# Patient Record
Sex: Female | Born: 1940 | Race: Black or African American | Hispanic: No | Smoking: Never smoker
Health system: Southern US, Community
[De-identification: ages and names within clinical notes are randomized; demographics above are authoritative.]

## PROBLEM LIST (undated history)

## (undated) DIAGNOSIS — E039 Hypothyroidism, unspecified: Secondary | ICD-10-CM

## (undated) DIAGNOSIS — I10 Essential (primary) hypertension: Secondary | ICD-10-CM

## (undated) DIAGNOSIS — T7840XA Allergy, unspecified, initial encounter: Secondary | ICD-10-CM

## (undated) DIAGNOSIS — E119 Type 2 diabetes mellitus without complications: Secondary | ICD-10-CM

## (undated) DIAGNOSIS — M109 Gout, unspecified: Secondary | ICD-10-CM

## (undated) DIAGNOSIS — N189 Chronic kidney disease, unspecified: Secondary | ICD-10-CM

## (undated) DIAGNOSIS — F0390 Unspecified dementia without behavioral disturbance: Secondary | ICD-10-CM

## (undated) DIAGNOSIS — J45909 Unspecified asthma, uncomplicated: Secondary | ICD-10-CM

## (undated) DIAGNOSIS — I639 Cerebral infarction, unspecified: Secondary | ICD-10-CM

## (undated) HISTORY — PX: COLONOSCOPY: SHX174

## (undated) HISTORY — DX: Unspecified asthma, uncomplicated: J45.909

## (undated) HISTORY — DX: Type 2 diabetes mellitus without complications: E11.9

## (undated) HISTORY — DX: Gout, unspecified: M10.9

## (undated) HISTORY — DX: Essential (primary) hypertension: I10

## (undated) HISTORY — PX: ROTATOR CUFF REPAIR: SHX139

## (undated) HISTORY — PX: ABDOMINAL HYSTERECTOMY: SHX81

## (undated) HISTORY — DX: Allergy, unspecified, initial encounter: T78.40XA

## (undated) HISTORY — PX: BACK SURGERY: SHX140

---

## 2004-04-29 ENCOUNTER — Other Ambulatory Visit: Payer: Self-pay

## 2005-03-10 ENCOUNTER — Ambulatory Visit: Payer: Self-pay | Admitting: General Surgery

## 2006-03-19 ENCOUNTER — Ambulatory Visit: Payer: Self-pay | Admitting: Gastroenterology

## 2006-03-21 ENCOUNTER — Ambulatory Visit: Payer: Self-pay | Admitting: Physician Assistant

## 2006-03-25 ENCOUNTER — Ambulatory Visit: Payer: Self-pay | Admitting: General Surgery

## 2006-05-21 ENCOUNTER — Other Ambulatory Visit: Payer: Self-pay

## 2006-05-21 ENCOUNTER — Ambulatory Visit: Payer: Self-pay | Admitting: Unknown Physician Specialty

## 2006-05-27 ENCOUNTER — Ambulatory Visit: Payer: Self-pay | Admitting: Family Medicine

## 2006-05-28 ENCOUNTER — Inpatient Hospital Stay: Payer: Self-pay | Admitting: Unknown Physician Specialty

## 2006-06-17 ENCOUNTER — Encounter: Payer: Self-pay | Admitting: Unknown Physician Specialty

## 2006-07-11 ENCOUNTER — Encounter: Payer: Self-pay | Admitting: Unknown Physician Specialty

## 2006-08-10 ENCOUNTER — Encounter: Payer: Self-pay | Admitting: Unknown Physician Specialty

## 2006-09-10 ENCOUNTER — Encounter: Payer: Self-pay | Admitting: Unknown Physician Specialty

## 2006-10-10 ENCOUNTER — Encounter: Payer: Self-pay | Admitting: Unknown Physician Specialty

## 2006-11-10 ENCOUNTER — Encounter: Payer: Self-pay | Admitting: Unknown Physician Specialty

## 2006-12-11 ENCOUNTER — Encounter: Payer: Self-pay | Admitting: Unknown Physician Specialty

## 2007-03-09 ENCOUNTER — Ambulatory Visit: Payer: Self-pay | Admitting: Specialist

## 2007-03-23 ENCOUNTER — Ambulatory Visit: Payer: Self-pay | Admitting: Family Medicine

## 2007-03-29 ENCOUNTER — Ambulatory Visit: Payer: Self-pay | Admitting: Specialist

## 2007-03-30 ENCOUNTER — Ambulatory Visit: Payer: Self-pay | Admitting: General Surgery

## 2007-05-05 ENCOUNTER — Encounter: Payer: Self-pay | Admitting: Orthopedic Surgery

## 2007-05-11 ENCOUNTER — Encounter: Payer: Self-pay | Admitting: Orthopedic Surgery

## 2007-06-11 ENCOUNTER — Encounter: Payer: Self-pay | Admitting: Orthopedic Surgery

## 2007-07-12 ENCOUNTER — Encounter: Payer: Self-pay | Admitting: Orthopedic Surgery

## 2008-04-13 ENCOUNTER — Ambulatory Visit: Payer: Self-pay | Admitting: General Surgery

## 2009-05-10 ENCOUNTER — Ambulatory Visit: Payer: Self-pay | Admitting: General Surgery

## 2010-05-14 ENCOUNTER — Ambulatory Visit: Payer: Self-pay | Admitting: General Surgery

## 2010-11-07 ENCOUNTER — Ambulatory Visit: Payer: Self-pay | Admitting: Orthopedic Surgery

## 2010-12-31 ENCOUNTER — Encounter: Payer: Self-pay | Admitting: Orthopedic Surgery

## 2011-01-09 ENCOUNTER — Encounter: Payer: Self-pay | Admitting: Orthopedic Surgery

## 2011-01-24 ENCOUNTER — Ambulatory Visit: Payer: Self-pay | Admitting: Internal Medicine

## 2011-05-13 ENCOUNTER — Ambulatory Visit: Payer: Self-pay | Admitting: Emergency Medicine

## 2011-05-15 ENCOUNTER — Ambulatory Visit: Payer: Self-pay | Admitting: Emergency Medicine

## 2011-05-15 LAB — CEA: CEA: 3 ng/mL (ref 0.0–4.7)

## 2011-05-20 ENCOUNTER — Ambulatory Visit: Payer: Self-pay | Admitting: Internal Medicine

## 2011-05-21 ENCOUNTER — Ambulatory Visit: Payer: Self-pay | Admitting: General Surgery

## 2011-09-02 ENCOUNTER — Ambulatory Visit: Payer: Self-pay | Admitting: Internal Medicine

## 2012-02-03 ENCOUNTER — Ambulatory Visit: Payer: Self-pay | Admitting: Internal Medicine

## 2012-02-09 ENCOUNTER — Ambulatory Visit: Payer: Self-pay | Admitting: Internal Medicine

## 2012-09-14 ENCOUNTER — Encounter: Payer: Self-pay | Admitting: Specialist

## 2012-10-10 ENCOUNTER — Encounter: Payer: Self-pay | Admitting: Specialist

## 2012-11-10 ENCOUNTER — Encounter: Payer: Self-pay | Admitting: Specialist

## 2012-12-11 ENCOUNTER — Encounter: Payer: Self-pay | Admitting: Specialist

## 2013-01-08 ENCOUNTER — Encounter: Payer: Self-pay | Admitting: Specialist

## 2013-02-08 ENCOUNTER — Encounter: Payer: Self-pay | Admitting: Specialist

## 2013-09-05 ENCOUNTER — Encounter: Payer: Self-pay | Admitting: *Deleted

## 2013-09-06 ENCOUNTER — Encounter: Payer: Self-pay | Admitting: Podiatry

## 2013-09-06 ENCOUNTER — Ambulatory Visit (INDEPENDENT_AMBULATORY_CARE_PROVIDER_SITE_OTHER): Payer: Medicare Other | Admitting: Podiatry

## 2013-09-06 VITALS — BP 139/63 | HR 84 | Resp 16 | Ht 62.0 in | Wt 225.0 lb

## 2013-09-06 DIAGNOSIS — M2040 Other hammer toe(s) (acquired), unspecified foot: Secondary | ICD-10-CM

## 2013-09-06 DIAGNOSIS — E1159 Type 2 diabetes mellitus with other circulatory complications: Secondary | ICD-10-CM

## 2013-09-06 NOTE — Progress Notes (Signed)
Subjective:     Patient ID: Maria Cunningham, female   DOB: Mar 16, 1941, 72 y.o.   MRN: [id number]  HPI patient states I am here to be measured for my diabetic shoes. States that her feet are painful and that she's had diabetes for a long time   Review of Systems  All other systems reviewed and are negative.       Objective:   Physical Exam  Nursing note and vitals reviewed. Constitutional: She is oriented to person, place, and time. She appears well-nourished.  Cardiovascular: Intact distal pulses.   Musculoskeletal: Normal range of motion.  Neurological: She is oriented to person, place, and time.   Patient is found to have structural changes in both feet with mild edema and diminishment of vibratory sensation in circulatory    Assessment:     At risk diabetic with circulatory vascular issues and structural    Plan:     Casted for diabetic shoes to wear to prevent ulceration

## 2013-10-18 ENCOUNTER — Telehealth: Payer: Self-pay | Admitting: *Deleted

## 2013-10-18 NOTE — Telephone Encounter (Signed)
Pt asked for an appt to pick up her diabetic shoes.

## 2013-10-28 ENCOUNTER — Ambulatory Visit (INDEPENDENT_AMBULATORY_CARE_PROVIDER_SITE_OTHER): Payer: Medicare Other | Admitting: Podiatry

## 2013-10-28 ENCOUNTER — Encounter: Payer: Self-pay | Admitting: Podiatry

## 2013-10-28 VITALS — BP 129/71 | HR 84 | Resp 16

## 2013-10-28 DIAGNOSIS — M2010 Hallux valgus (acquired), unspecified foot: Secondary | ICD-10-CM

## 2013-10-28 DIAGNOSIS — B351 Tinea unguium: Secondary | ICD-10-CM

## 2013-10-28 DIAGNOSIS — M79609 Pain in unspecified limb: Secondary | ICD-10-CM

## 2013-10-28 DIAGNOSIS — E1159 Type 2 diabetes mellitus with other circulatory complications: Secondary | ICD-10-CM

## 2013-10-28 NOTE — Progress Notes (Signed)
Subjective:     Patient ID: Maria Cunningham, female   DOB: 03-01-1941, 72 y.o.   MRN: [id number]  HPI patient states that I need my nails taking care of and I'm here to pick up my diabetic shoes. States the nails are been bothering her in her feet in general bother her   Review of Systems     Objective:   Physical Exam Neurovascular status unchanged with diminishment of sharp tall vibratory and circulatory status along with structural bunion deformity and also nail disease 1-5 of both feet    Assessment:     At wrist diabetic with painful nailbeds and foot structure that lends itself towards ulcerations for the long-term    Plan:     H&P performed and debridement of nailbeds 1-5 both feet done today. We fitted for diabetic shoes they do well and inserts feel good the patient and she was given instructions on in. Reappoint her recheck

## 2013-10-28 NOTE — Progress Notes (Signed)
   Subjective:    Patient ID: Maria Cunningham, female    DOB: July 15, 1941, 72 y.o.   MRN: [id number]  HPI Comments: Trying on my new shoes and trimming my nails   Diabetes      Review of Systems     Objective:   Physical Exam        Assessment & Plan:

## 2020-02-28 ENCOUNTER — Encounter (HOSPITAL_COMMUNITY): Payer: Self-pay | Admitting: Pharmacy Technician

## 2020-02-28 ENCOUNTER — Other Ambulatory Visit: Payer: Self-pay

## 2020-02-28 ENCOUNTER — Emergency Department (HOSPITAL_COMMUNITY): Payer: Medicare Other

## 2020-02-28 ENCOUNTER — Emergency Department (HOSPITAL_COMMUNITY)
Admission: EM | Admit: 2020-02-28 | Discharge: 2020-02-29 | Disposition: A | Discharge status: Home / Self Care | Payer: Medicare Other | Attending: Emergency Medicine | Admitting: Emergency Medicine

## 2020-02-28 DIAGNOSIS — Y929 Unspecified place or not applicable: Secondary | ICD-10-CM | POA: Diagnosis not present

## 2020-02-28 DIAGNOSIS — Z79899 Other long term (current) drug therapy: Secondary | ICD-10-CM | POA: Diagnosis not present

## 2020-02-28 DIAGNOSIS — S1093XA Contusion of unspecified part of neck, initial encounter: Secondary | ICD-10-CM | POA: Insufficient documentation

## 2020-02-28 DIAGNOSIS — I10 Essential (primary) hypertension: Secondary | ICD-10-CM | POA: Diagnosis not present

## 2020-02-28 DIAGNOSIS — Z7982 Long term (current) use of aspirin: Secondary | ICD-10-CM | POA: Diagnosis not present

## 2020-02-28 DIAGNOSIS — S0990XA Unspecified injury of head, initial encounter: Secondary | ICD-10-CM | POA: Insufficient documentation

## 2020-02-28 DIAGNOSIS — E119 Type 2 diabetes mellitus without complications: Secondary | ICD-10-CM | POA: Insufficient documentation

## 2020-02-28 DIAGNOSIS — Y939 Activity, unspecified: Secondary | ICD-10-CM | POA: Diagnosis not present

## 2020-02-28 DIAGNOSIS — S0181XA Laceration without foreign body of other part of head, initial encounter: Secondary | ICD-10-CM | POA: Insufficient documentation

## 2020-02-28 DIAGNOSIS — Y999 Unspecified external cause status: Secondary | ICD-10-CM | POA: Insufficient documentation

## 2020-02-28 DIAGNOSIS — Z7984 Long term (current) use of oral hypoglycemic drugs: Secondary | ICD-10-CM | POA: Insufficient documentation

## 2020-02-28 LAB — COMPREHENSIVE METABOLIC PANEL
ALT: 18 U/L (ref 0–44)
AST: 19 U/L (ref 15–41)
Albumin: 3.6 g/dL (ref 3.5–5.0)
Alkaline Phosphatase: 53 U/L (ref 38–126)
Anion gap: 10 (ref 5–15)
BUN: 54 mg/dL — ABNORMAL HIGH (ref 8–23)
CO2: 21 mmol/L — ABNORMAL LOW (ref 22–32)
Calcium: 9.8 mg/dL (ref 8.9–10.3)
Chloride: 106 mmol/L (ref 98–111)
Creatinine, Ser: 2.02 mg/dL — ABNORMAL HIGH (ref 0.44–1.00)
GFR calc Af Amer: 27 mL/min — ABNORMAL LOW (ref >60)
GFR calc non Af Amer: 23 mL/min — ABNORMAL LOW (ref >60)
Glucose, Bld: 192 mg/dL — ABNORMAL HIGH (ref 70–99)
Potassium: 3.4 mmol/L — ABNORMAL LOW (ref 3.5–5.1)
Sodium: 137 mmol/L (ref 135–145)
Total Bilirubin: 0.5 mg/dL (ref 0.3–1.2)
Total Protein: 7.2 g/dL (ref 6.5–8.1)

## 2020-02-28 LAB — CBC WITH DIFFERENTIAL/PLATELET
Abs Immature Granulocytes: 0.1 10*3/uL — ABNORMAL HIGH (ref 0.00–0.07)
Basophils Absolute: 0 10*3/uL (ref 0.0–0.1)
Basophils Relative: 0 %
Eosinophils Absolute: 0.1 10*3/uL (ref 0.0–0.5)
Eosinophils Relative: 1 %
HCT: 38.3 % (ref 36.0–46.0)
Hemoglobin: 12 g/dL (ref 12.0–15.0)
Immature Granulocytes: 1 %
Lymphocytes Relative: 27 %
Lymphs Abs: 4.2 10*3/uL — ABNORMAL HIGH (ref 0.7–4.0)
MCH: 28.7 pg (ref 26.0–34.0)
MCHC: 31.3 g/dL (ref 30.0–36.0)
MCV: 91.6 fL (ref 80.0–100.0)
Monocytes Absolute: 1.1 10*3/uL — ABNORMAL HIGH (ref 0.1–1.0)
Monocytes Relative: 7 %
Neutro Abs: 10 10*3/uL — ABNORMAL HIGH (ref 1.7–7.7)
Neutrophils Relative %: 64 %
Platelets: 292 10*3/uL (ref 150–400)
RBC: 4.18 MIL/uL (ref 3.87–5.11)
RDW: 16 % — ABNORMAL HIGH (ref 11.5–15.5)
WBC: 15.5 10*3/uL — ABNORMAL HIGH (ref 4.0–10.5)
nRBC: 0 % (ref 0.0–0.2)

## 2020-02-28 LAB — I-STAT CHEM 8, ED
BUN: 47 mg/dL — ABNORMAL HIGH (ref 8–23)
Calcium, Ion: 1.33 mmol/L (ref 1.15–1.40)
Chloride: 107 mmol/L (ref 98–111)
Creatinine, Ser: 1.8 mg/dL — ABNORMAL HIGH (ref 0.44–1.00)
Glucose, Bld: 180 mg/dL — ABNORMAL HIGH (ref 70–99)
HCT: 39 % (ref 36.0–46.0)
Hemoglobin: 13.3 g/dL (ref 12.0–15.0)
Potassium: 3.4 mmol/L — ABNORMAL LOW (ref 3.5–5.1)
Sodium: 140 mmol/L (ref 135–145)
TCO2: 22 mmol/L (ref 22–32)

## 2020-02-28 LAB — PROTIME-INR
INR: 1 (ref 0.8–1.2)
Prothrombin Time: 13 seconds (ref 11.4–15.2)

## 2020-02-28 LAB — ETHANOL: Alcohol, Ethyl (B): <10 mg/dL (ref <10)

## 2020-02-28 LAB — TROPONIN I (HIGH SENSITIVITY): Troponin I (High Sensitivity): 10 ng/L (ref <18)

## 2020-02-28 MED ORDER — SODIUM CHLORIDE 0.9 % IV SOLN: Freq: Once | INTRAVENOUS | Status: DC
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
## 2020-02-28 NOTE — Progress Notes (Signed)
Chaplain engaged in initial visit with Maria Cunningham and offered support.  Chaplain is available to follow-up as needed.
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
## 2020-06-15 ENCOUNTER — Encounter: Payer: Self-pay | Admitting: Emergency Medicine

## 2020-06-15 ENCOUNTER — Emergency Department
Admission: EM | Admit: 2020-06-15 | Discharge: 2020-06-16 | Disposition: A | Discharge status: Home / Self Care | Payer: Medicare Other | Attending: Emergency Medicine | Admitting: Emergency Medicine

## 2020-06-15 ENCOUNTER — Emergency Department: Payer: Medicare Other

## 2020-06-15 ENCOUNTER — Other Ambulatory Visit: Payer: Self-pay

## 2020-06-15 DIAGNOSIS — Z5321 Procedure and treatment not carried out due to patient leaving prior to being seen by health care provider: Secondary | ICD-10-CM | POA: Insufficient documentation

## 2020-06-15 DIAGNOSIS — R079 Chest pain, unspecified: Secondary | ICD-10-CM | POA: Insufficient documentation

## 2020-06-15 LAB — BASIC METABOLIC PANEL
Anion gap: 10 (ref 5–15)
BUN: 26 mg/dL — ABNORMAL HIGH (ref 8–23)
CO2: 25 mmol/L (ref 22–32)
Calcium: 9.5 mg/dL (ref 8.9–10.3)
Chloride: 101 mmol/L (ref 98–111)
Creatinine, Ser: 1.29 mg/dL — ABNORMAL HIGH (ref 0.44–1.00)
GFR calc Af Amer: 46 mL/min — ABNORMAL LOW (ref >60)
GFR calc non Af Amer: 39 mL/min — ABNORMAL LOW (ref >60)
Glucose, Bld: 120 mg/dL — ABNORMAL HIGH (ref 70–99)
Potassium: 4.1 mmol/L (ref 3.5–5.1)
Sodium: 136 mmol/L (ref 135–145)

## 2020-06-15 LAB — CBC
HCT: 35 % — ABNORMAL LOW (ref 36.0–46.0)
Hemoglobin: 11.7 g/dL — ABNORMAL LOW (ref 12.0–15.0)
MCH: 30.6 pg (ref 26.0–34.0)
MCHC: 33.4 g/dL (ref 30.0–36.0)
MCV: 91.6 fL (ref 80.0–100.0)
Platelets: 257 10*3/uL (ref 150–400)
RBC: 3.82 MIL/uL — ABNORMAL LOW (ref 3.87–5.11)
RDW: 13.8 % (ref 11.5–15.5)
WBC: 7.3 10*3/uL (ref 4.0–10.5)
nRBC: 0 % (ref 0.0–0.2)

## 2020-06-15 LAB — TROPONIN I (HIGH SENSITIVITY)
Troponin I (High Sensitivity): 8 ng/L (ref <18)
Troponin I (High Sensitivity): 9 ng/L (ref <18)

## 2020-06-15 NOTE — ED Triage Notes (Signed)
Here for right side chest pain starting today. Pain is intermittent. Denies any associated sx. NAD. Unlabored, VSS.  No history of similar.

## 2020-08-02 ENCOUNTER — Other Ambulatory Visit: Payer: Self-pay | Admitting: Internal Medicine

## 2020-08-02 DIAGNOSIS — Z1231 Encounter for screening mammogram for malignant neoplasm of breast: Secondary | ICD-10-CM

## 2020-08-23 ENCOUNTER — Other Ambulatory Visit (HOSPITAL_COMMUNITY): Payer: Self-pay | Admitting: Internal Medicine

## 2020-08-23 DIAGNOSIS — R413 Other amnesia: Secondary | ICD-10-CM

## 2020-09-06 ENCOUNTER — Other Ambulatory Visit: Payer: Self-pay

## 2020-09-06 ENCOUNTER — Ambulatory Visit (HOSPITAL_COMMUNITY)
Admission: RE | Admit: 2020-09-06 | Discharge: 2020-09-06 | Disposition: A | Discharge status: Home / Self Care | Payer: Medicare Other | Source: Ambulatory Visit | Attending: Internal Medicine | Admitting: Internal Medicine

## 2020-09-06 DIAGNOSIS — R413 Other amnesia: Secondary | ICD-10-CM | POA: Diagnosis not present

## 2021-06-26 ENCOUNTER — Other Ambulatory Visit: Payer: Self-pay | Admitting: Internal Medicine

## 2021-06-26 DIAGNOSIS — N178 Other acute kidney failure: Secondary | ICD-10-CM

## 2021-07-09 ENCOUNTER — Ambulatory Visit: Admission: RE | Admit: 2021-07-09 | Payer: Medicare Other | Source: Ambulatory Visit

## 2021-07-31 ENCOUNTER — Ambulatory Visit
Admission: RE | Admit: 2021-07-31 | Discharge: 2021-07-31 | Disposition: A | Discharge status: Home / Self Care | Payer: Medicare Other | Source: Ambulatory Visit | Attending: Internal Medicine | Admitting: Internal Medicine

## 2021-07-31 ENCOUNTER — Other Ambulatory Visit: Payer: Self-pay

## 2021-07-31 DIAGNOSIS — N178 Other acute kidney failure: Secondary | ICD-10-CM | POA: Insufficient documentation

## 2022-05-29 IMAGING — US US RENAL
1 series · 14 of 25 positions shown · non-contrast
Comparison: Abdominopelvic CT 05/15/2011, no interval imaging
available.

CLINICAL DATA: Acute renal failure with other unspecified
pathological lesion and kidney.

EXAM:
RENAL / URINARY TRACT ULTRASOUND COMPLETE

[Series 1: us renal · 0.17mm/px · 14 of 55 slices shown]
[im 1/55]
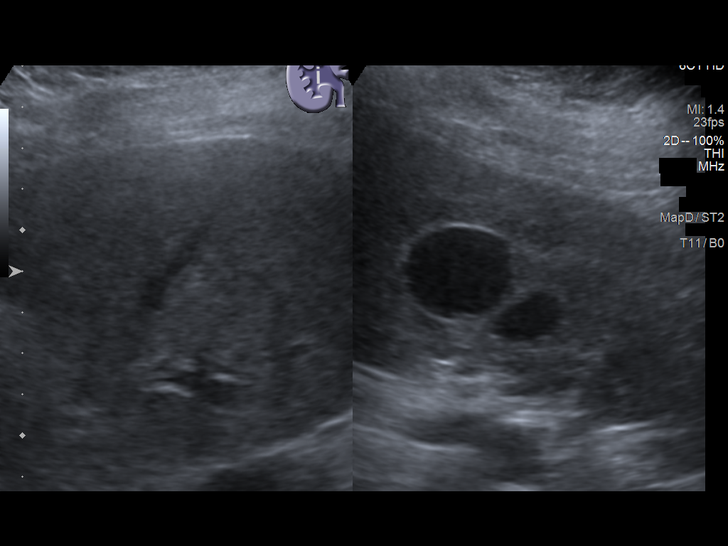
[im 5/55]
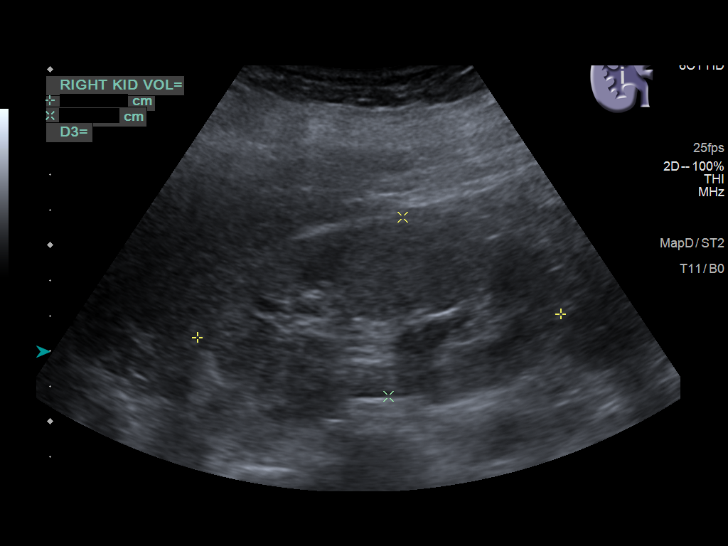
[im 10/55]
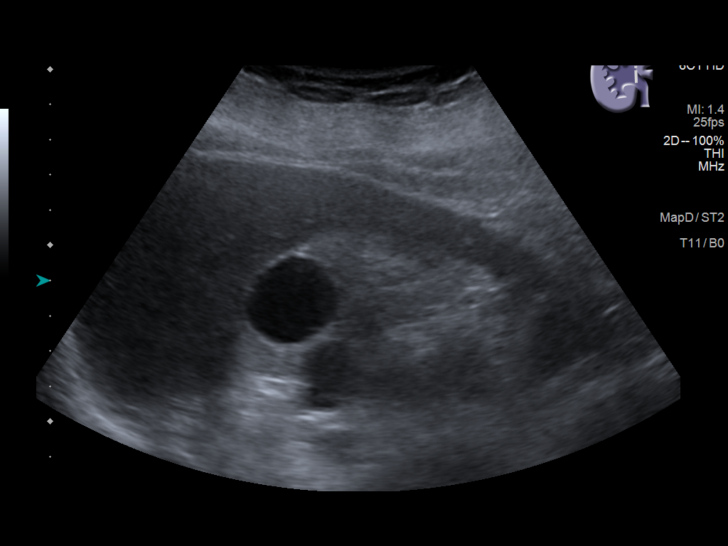
[im 14/55]
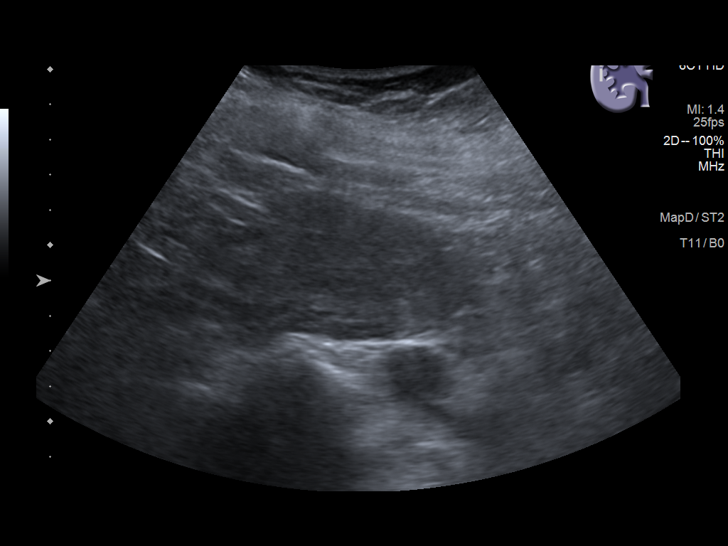
[im 19/55]
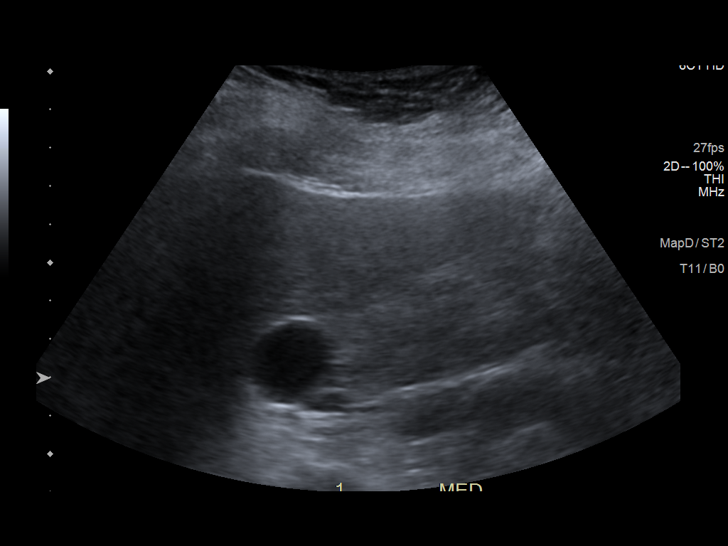
[im 21/55]
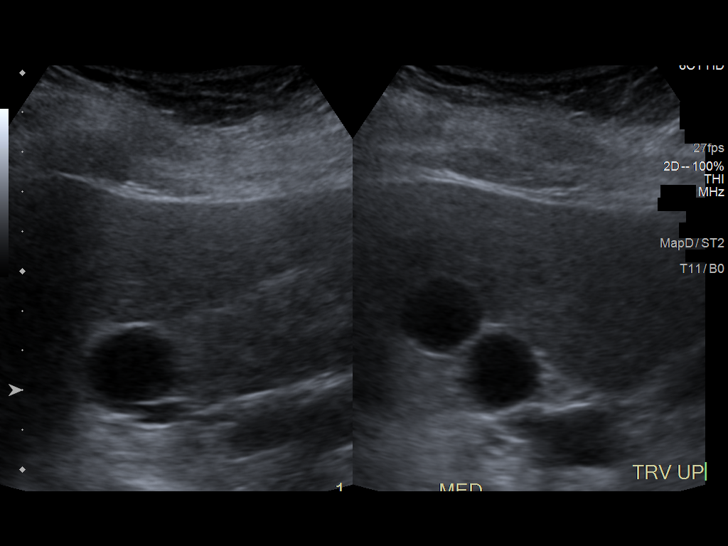
[im 25/55]
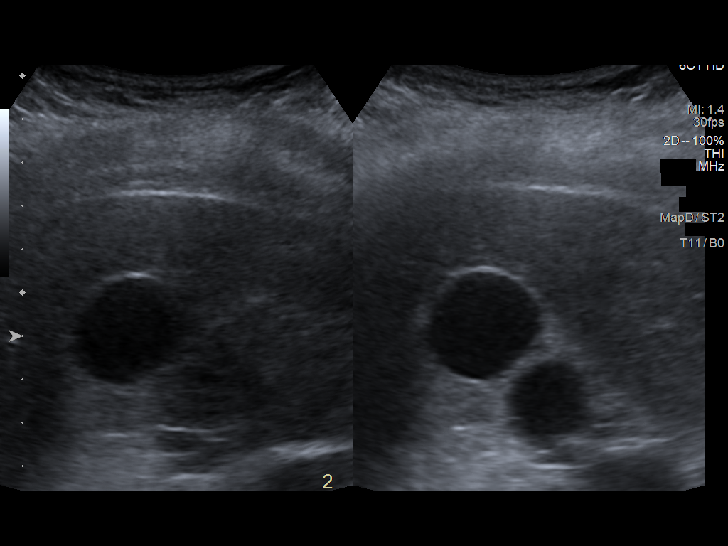
[im 30/55]
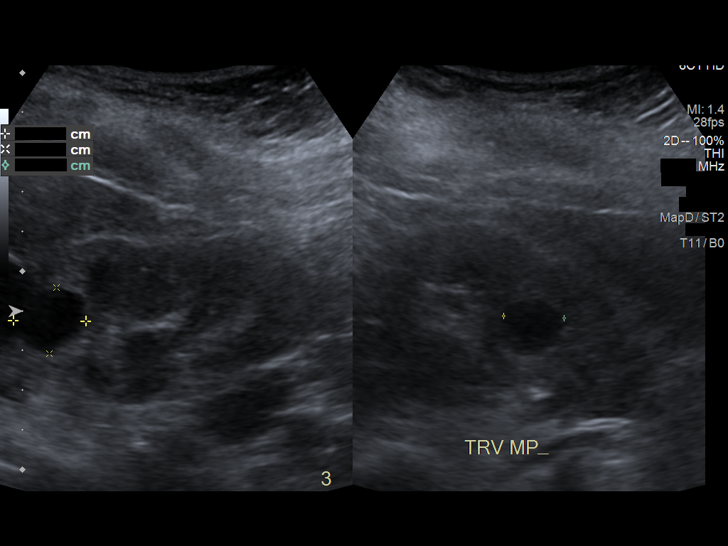
[im 34/55]
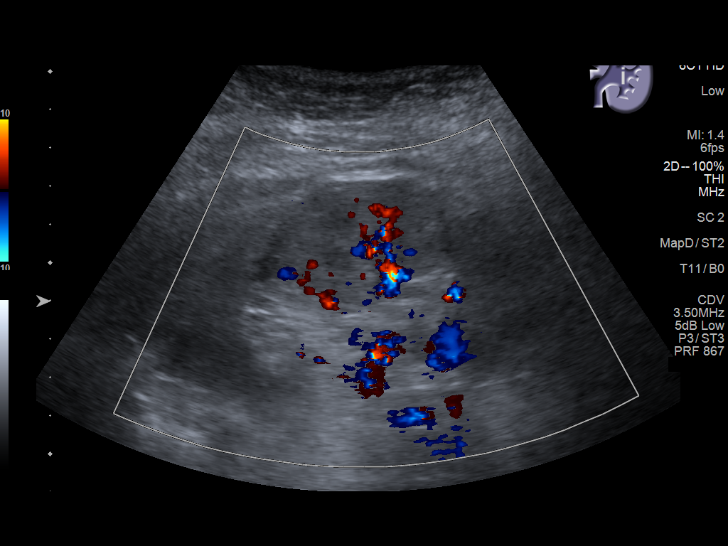
[im 37/55]
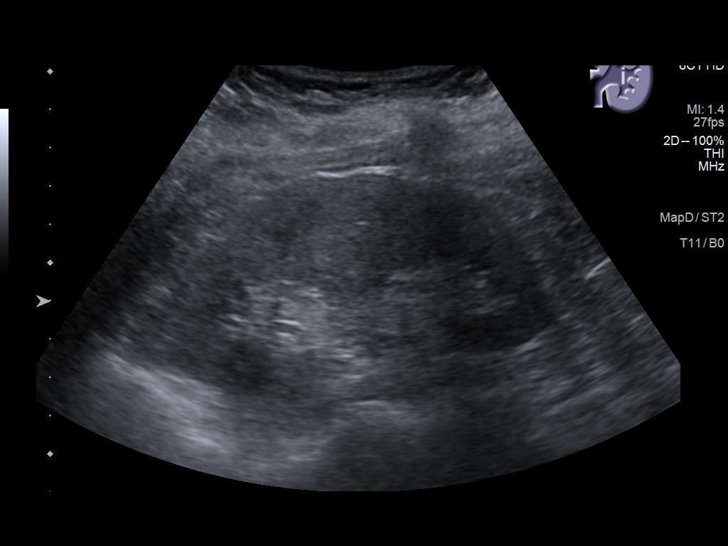
[im 41/55]
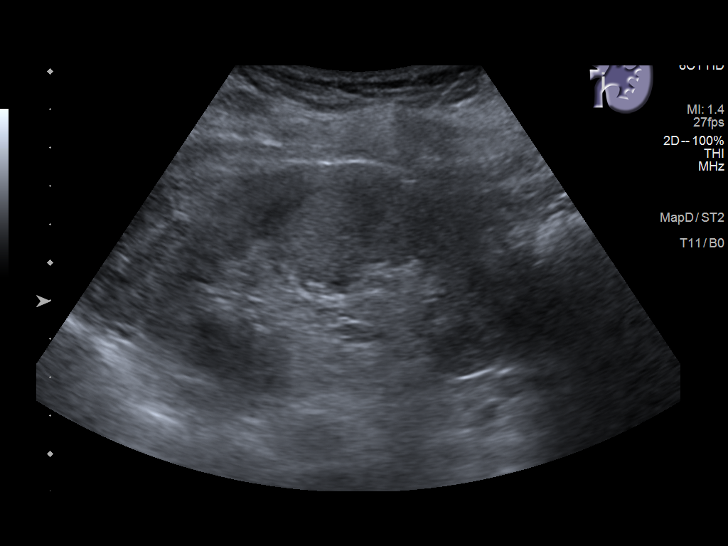
[im 46/55]
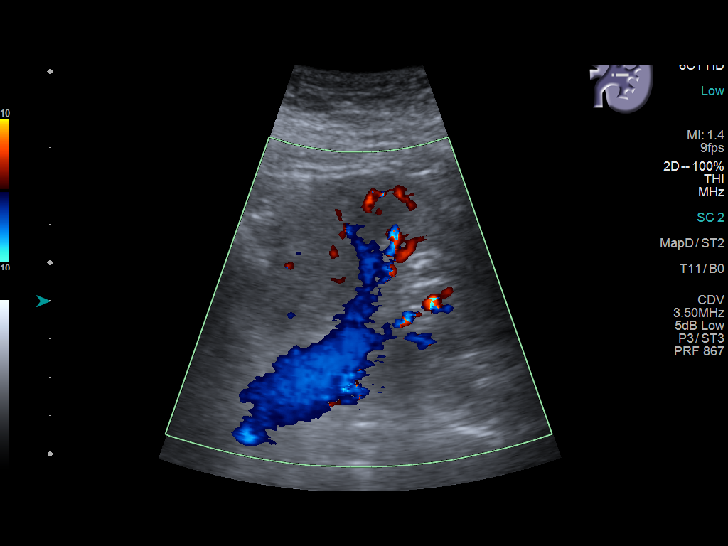
[im 50/55]
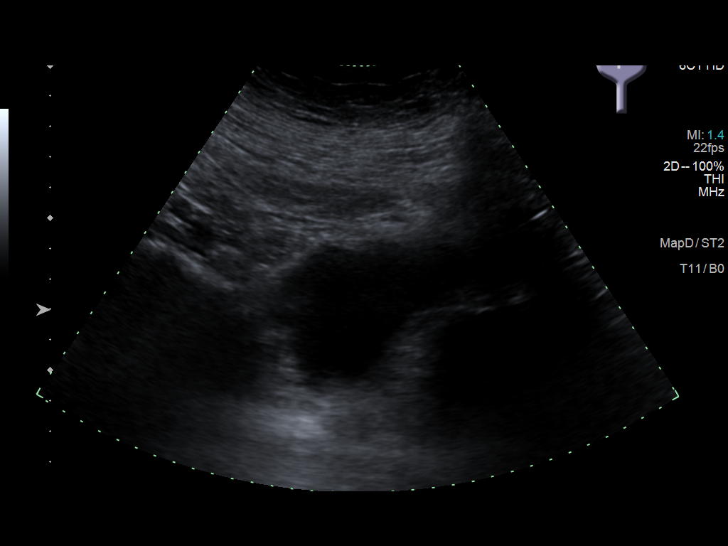
[im 55/55]
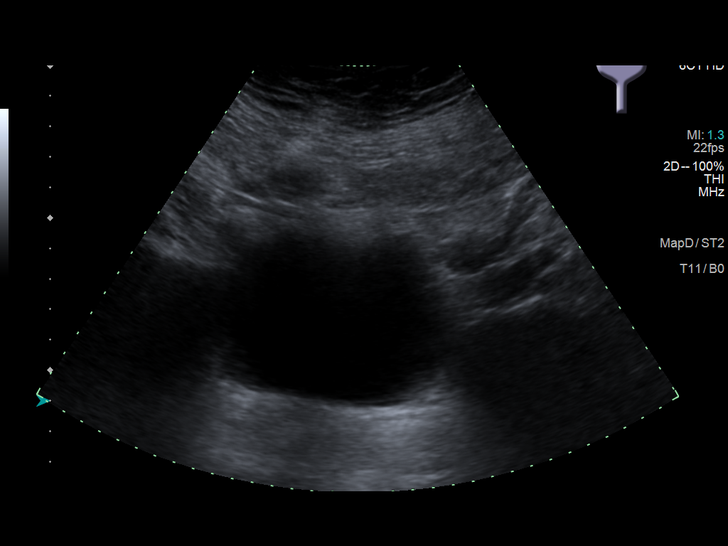

[14 of 25 positions shown; findings below may reference images not displayed]

FINDINGS: Right Kidney:

Renal measurements: 10.3 x 5.1 x 3.6 cm = volume: 98 mL. No
hydronephrosis. Borderline increased parenchymal echogenicity. There
are 3 cysts in the mid and upper right kidney. Largest cyst measures
2.7 x 2.7 x 2.6 cm. There is an adjacent 2.0 x 2.4 x 1.8 cm cyst.
Cyst in the mid kidney measures 1.8 x 1.7 x 1.5 cm. No evidence of
solid lesion. No visualized stone.

Left Kidney:

Renal measurements: 11.3 x 5.0 x 5.0 cm = volume: 147 mL. Mildly
increased echogenicity. No hydronephrosis. No visualized stone or
focal lesion.

Bladder:

Appears normal for degree of bladder distention.

Other:

None.
IMPRESSION: 1. Mildly increased renal parenchymal echogenicity consistent with
chronic medical renal disease. No obstructive uropathy.
2. Three simple cysts in the mid and upper right kidney. No solid
lesion.

## 2022-08-18 ENCOUNTER — Emergency Department: Payer: Medicare Other

## 2022-08-18 ENCOUNTER — Other Ambulatory Visit: Payer: Self-pay

## 2022-08-18 DIAGNOSIS — N184 Chronic kidney disease, stage 4 (severe): Secondary | ICD-10-CM | POA: Insufficient documentation

## 2022-08-18 DIAGNOSIS — E039 Hypothyroidism, unspecified: Secondary | ICD-10-CM | POA: Insufficient documentation

## 2022-08-18 DIAGNOSIS — R55 Syncope and collapse: Secondary | ICD-10-CM | POA: Diagnosis present

## 2022-08-18 DIAGNOSIS — F0390 Unspecified dementia without behavioral disturbance: Secondary | ICD-10-CM | POA: Insufficient documentation

## 2022-08-18 DIAGNOSIS — J45909 Unspecified asthma, uncomplicated: Secondary | ICD-10-CM | POA: Diagnosis not present

## 2022-08-18 DIAGNOSIS — I129 Hypertensive chronic kidney disease with stage 1 through stage 4 chronic kidney disease, or unspecified chronic kidney disease: Secondary | ICD-10-CM | POA: Diagnosis not present

## 2022-08-18 DIAGNOSIS — E1122 Type 2 diabetes mellitus with diabetic chronic kidney disease: Secondary | ICD-10-CM | POA: Diagnosis not present

## 2022-08-18 DIAGNOSIS — Z79899 Other long term (current) drug therapy: Secondary | ICD-10-CM | POA: Insufficient documentation

## 2022-08-18 DIAGNOSIS — N179 Acute kidney failure, unspecified: Secondary | ICD-10-CM | POA: Diagnosis not present

## 2022-08-18 DIAGNOSIS — Z7982 Long term (current) use of aspirin: Secondary | ICD-10-CM | POA: Diagnosis not present

## 2022-08-18 DIAGNOSIS — Z7984 Long term (current) use of oral hypoglycemic drugs: Secondary | ICD-10-CM | POA: Insufficient documentation

## 2022-08-18 LAB — BASIC METABOLIC PANEL
Anion gap: 6 (ref 5–15)
BUN: 36 mg/dL — ABNORMAL HIGH (ref 8–23)
CO2: 25 mmol/L (ref 22–32)
Calcium: 9.2 mg/dL (ref 8.9–10.3)
Chloride: 112 mmol/L — ABNORMAL HIGH (ref 98–111)
Creatinine, Ser: 2.24 mg/dL — ABNORMAL HIGH (ref 0.44–1.00)
GFR, Estimated: 21 mL/min — ABNORMAL LOW (ref >60)
Glucose, Bld: 130 mg/dL — ABNORMAL HIGH (ref 70–99)
Potassium: 3.9 mmol/L (ref 3.5–5.1)
Sodium: 143 mmol/L (ref 135–145)

## 2022-08-18 LAB — CBC
HCT: 35.7 % — ABNORMAL LOW (ref 36.0–46.0)
Hemoglobin: 11.2 g/dL — ABNORMAL LOW (ref 12.0–15.0)
MCH: 29.8 pg (ref 26.0–34.0)
MCHC: 31.4 g/dL (ref 30.0–36.0)
MCV: 94.9 fL (ref 80.0–100.0)
Platelets: 244 10*3/uL (ref 150–400)
RBC: 3.76 MIL/uL — ABNORMAL LOW (ref 3.87–5.11)
RDW: 13.7 % (ref 11.5–15.5)
WBC: 9 10*3/uL (ref 4.0–10.5)
nRBC: 0 % (ref 0.0–0.2)

## 2022-08-18 LAB — CBG MONITORING, ED: Glucose-Capillary: 135 mg/dL — ABNORMAL HIGH (ref 70–99)

## 2022-08-18 LAB — TROPONIN I (HIGH SENSITIVITY): Troponin I (High Sensitivity): 7 ng/L (ref <18)

## 2022-08-18 NOTE — ED Triage Notes (Signed)
Pt comes from via EMS with c/o syncope. Pt states she passed out while talking to her daughter. Daughter states they were mid conversation when pt laid back and stopped moving/ responding. NAD at this time. Denies CP and SOB

## 2022-08-19 ENCOUNTER — Observation Stay
Admit: 2022-08-19 | Discharge: 2022-08-19 | Disposition: A | Discharge status: Home / Self Care | Payer: Medicare Other | Attending: Internal Medicine | Admitting: Internal Medicine

## 2022-08-19 ENCOUNTER — Other Ambulatory Visit: Payer: Self-pay

## 2022-08-19 ENCOUNTER — Encounter: Payer: Self-pay | Admitting: Internal Medicine

## 2022-08-19 ENCOUNTER — Observation Stay: Payer: Medicare Other

## 2022-08-19 ENCOUNTER — Observation Stay
Admission: EM | Admit: 2022-08-19 | Discharge: 2022-08-20 | Disposition: A | Discharge status: Home / Self Care | Payer: Medicare Other | Attending: Internal Medicine | Admitting: Internal Medicine

## 2022-08-19 DIAGNOSIS — E039 Hypothyroidism, unspecified: Secondary | ICD-10-CM | POA: Diagnosis present

## 2022-08-19 DIAGNOSIS — I10 Essential (primary) hypertension: Secondary | ICD-10-CM | POA: Diagnosis present

## 2022-08-19 DIAGNOSIS — M109 Gout, unspecified: Secondary | ICD-10-CM | POA: Diagnosis present

## 2022-08-19 DIAGNOSIS — N189 Chronic kidney disease, unspecified: Secondary | ICD-10-CM

## 2022-08-19 DIAGNOSIS — N179 Acute kidney failure, unspecified: Secondary | ICD-10-CM

## 2022-08-19 DIAGNOSIS — E1129 Type 2 diabetes mellitus with other diabetic kidney complication: Secondary | ICD-10-CM | POA: Diagnosis present

## 2022-08-19 DIAGNOSIS — J4520 Mild intermittent asthma, uncomplicated: Secondary | ICD-10-CM

## 2022-08-19 DIAGNOSIS — R55 Syncope and collapse: Secondary | ICD-10-CM | POA: Diagnosis not present

## 2022-08-19 DIAGNOSIS — N184 Chronic kidney disease, stage 4 (severe): Secondary | ICD-10-CM

## 2022-08-19 DIAGNOSIS — J45909 Unspecified asthma, uncomplicated: Secondary | ICD-10-CM | POA: Diagnosis present

## 2022-08-19 LAB — URINALYSIS, ROUTINE W REFLEX MICROSCOPIC
Bilirubin Urine: NEGATIVE
Glucose, UA: NEGATIVE mg/dL
Hgb urine dipstick: NEGATIVE
Ketones, ur: NEGATIVE mg/dL
Nitrite: NEGATIVE
Protein, ur: 100 mg/dL — AB
Specific Gravity, Urine: 1.016 (ref 1.005–1.030)
pH: 5 (ref 5.0–8.0)

## 2022-08-19 LAB — ECHOCARDIOGRAM COMPLETE
AR max vel: 1.9 cm2
AV Area VTI: 1.87 cm2
AV Area mean vel: 1.91 cm2
AV Mean grad: 6 mmHg
AV Peak grad: 10.9 mmHg
Ao pk vel: 1.65 m/s
Area-P 1/2: 3.54 cm2
S' Lateral: 2.5 cm
Weight: 3200 oz

## 2022-08-19 LAB — TROPONIN I (HIGH SENSITIVITY): Troponin I (High Sensitivity): 7 ng/L (ref <18)

## 2022-08-19 LAB — BRAIN NATRIURETIC PEPTIDE: B Natriuretic Peptide: 60.8 pg/mL (ref 0.0–100.0)

## 2022-08-19 LAB — MAGNESIUM: Magnesium: 2 mg/dL (ref 1.7–2.4)

## 2022-08-19 LAB — CBG MONITORING, ED
Glucose-Capillary: 135 mg/dL — ABNORMAL HIGH (ref 70–99)
Glucose-Capillary: 107 mg/dL — ABNORMAL HIGH (ref 70–99)
Glucose-Capillary: 93 mg/dL (ref 70–99)

## 2022-08-19 MED ORDER — LEVOTHYROXINE SODIUM 50 MCG PO TABS
50.0000 ug | ORAL_TABLET | Freq: Every day | ORAL | Status: DC
  Administered 2022-08-20: 50 ug via ORAL
  Filled 2022-08-19: qty 1

## 2022-08-19 MED ORDER — DM-GUAIFENESIN ER 30-600 MG PO TB12: 1.0000 | ORAL_TABLET | Freq: Two times a day (BID) | ORAL | Status: DC | PRN

## 2022-08-19 MED ORDER — INSULIN ASPART 100 UNIT/ML IJ SOLN
0.0000 [IU] | Freq: Three times a day (TID) | INTRAMUSCULAR | Status: DC
  Administered 2022-08-19: 1 [IU] via SUBCUTANEOUS
  Filled 2022-08-19: qty 1

## 2022-08-19 MED ORDER — IRBESARTAN 75 MG PO TABS
75.0000 mg | ORAL_TABLET | Freq: Every day | ORAL | Status: DC
  Administered 2022-08-19 – 2022-08-20 (×2): 75 mg via ORAL
  Filled 2022-08-19 (×2): qty 1

## 2022-08-19 MED ORDER — MOMETASONE FURO-FORMOTEROL FUM 200-5 MCG/ACT IN AERO: 2.0000 | INHALATION_SPRAY | Freq: Two times a day (BID) | RESPIRATORY_TRACT | Status: DC

## 2022-08-19 MED ORDER — HYDRALAZINE HCL 20 MG/ML IJ SOLN: 5.0000 mg | INTRAMUSCULAR | Status: DC | PRN

## 2022-08-19 MED ORDER — ENOXAPARIN SODIUM 30 MG/0.3ML IJ SOSY
30.0000 mg | PREFILLED_SYRINGE | INTRAMUSCULAR | Status: DC
  Administered 2022-08-19 – 2022-08-20 (×2): 30 mg via SUBCUTANEOUS
  Filled 2022-08-19 (×2): qty 0.3

## 2022-08-19 MED ORDER — ONDANSETRON HCL 4 MG/2ML IJ SOLN: 4.0000 mg | Freq: Three times a day (TID) | INTRAMUSCULAR | Status: DC | PRN

## 2022-08-19 MED ORDER — ALLOPURINOL 100 MG PO TABS
100.0000 mg | ORAL_TABLET | Freq: Every day | ORAL | Status: DC
  Administered 2022-08-19: 100 mg via ORAL
  Filled 2022-08-19 (×2): qty 1

## 2022-08-19 MED ORDER — INSULIN ASPART 100 UNIT/ML IJ SOLN: 0.0000 [IU] | Freq: Every day | INTRAMUSCULAR | Status: DC

## 2022-08-19 MED ORDER — ACETAMINOPHEN 325 MG PO TABS: 650.0000 mg | ORAL_TABLET | Freq: Four times a day (QID) | ORAL | Status: DC | PRN

## 2022-08-19 MED ORDER — GABAPENTIN 100 MG PO CAPS
200.0000 mg | ORAL_CAPSULE | Freq: Every day | ORAL | Status: DC
  Administered 2022-08-19: 200 mg via ORAL
  Filled 2022-08-19: qty 2

## 2022-08-19 MED ORDER — BACLOFEN 10 MG PO TABS
5.0000 mg | ORAL_TABLET | Freq: Every day | ORAL | Status: DC
  Administered 2022-08-19 – 2022-08-20 (×2): 5 mg via ORAL
  Filled 2022-08-19 (×2): qty 0.5

## 2022-08-19 MED ORDER — SODIUM CHLORIDE 0.9 % IV BOLUS (SEPSIS)
500.0000 mL | Freq: Once | INTRAVENOUS | Status: AC
  Administered 2022-08-19: 500 mL via INTRAVENOUS

## 2022-08-19 MED ORDER — ALBUTEROL SULFATE (2.5 MG/3ML) 0.083% IN NEBU: 2.5000 mg | INHALATION_SOLUTION | RESPIRATORY_TRACT | Status: DC | PRN

## 2022-08-19 MED ORDER — FELODIPINE ER 10 MG PO TB24
10.0000 mg | ORAL_TABLET | Freq: Every day | ORAL | Status: DC
  Administered 2022-08-19 – 2022-08-20 (×2): 10 mg via ORAL
  Filled 2022-08-19 (×2): qty 1

## 2022-08-19 MED ORDER — ASPIRIN 81 MG PO TBEC
81.0000 mg | DELAYED_RELEASE_TABLET | ORAL | Status: DC
  Administered 2022-08-20: 81 mg via ORAL
  Filled 2022-08-19: qty 1

## 2022-08-19 MED ORDER — MELATONIN 5 MG PO TABS
5.0000 mg | ORAL_TABLET | Freq: Once | ORAL | Status: AC
  Administered 2022-08-19: 5 mg via ORAL
  Filled 2022-08-19: qty 1

## 2022-08-19 NOTE — Assessment & Plan Note (Signed)
Allopurinol  

## 2022-08-19 NOTE — H&P (Signed)
History and Physical    Maria Cunningham RDE:[id number] DOB: Apr 14, 1941 DOA: 08/19/2022  Referring MD/NP/PA:   PCP: Gladstone Lighter, MD   Patient coming from:  The patient is coming from home.    Chief Complaint: syncope  HPI: Maria Cunningham is a 81 y.o. female with medical history significant of dementia, hypertension, diabetes mellitus, asthma, hypothyroidism, gout, CKD-4, who presents with syncope.  Per her daughter at the bedside, patient passed out at about 9 PM last night when she was sitting and talking to her daughter.  Episode lasted for about 5 minutes.  No seizure activity noted.  Patient does not have prodromal symptoms.  Patient moves all extremities normally.  No facial droop or slurred speech.  Patient does not have chest pain, cough, shortness breath.  No fever or chills.  No nausea, vomiting, diarrhea or abdominal pain.  No symptoms of UTI.  No recent long distance traveling.  No tenderness in the calf areas.  Mental status at baseline.  Per ED physician, EMS reported positive orthostatic vital sign.   Data reviewed independently and ED Course: pt was found to have WBC 9.0, troponin level 7, 7, BNP 60.8, renal function slightly worsening than baseline, temperature 97.5, blood pressure 154/71, heart rate 109, 66, RR 21, oxygen saturation 97% on room air.  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  Patient is placed on telemetry bed for patient,  Chest x-ray: 1. Mild cardiomegaly with vascular congestion and trace fluid in the fissures. 2. Mild peribronchial thickening, may be bronchitic or congestive.  EKG: I have personally reviewed.  Sinus rhythm, QTc 439, LAE, LAD, frequent PVC.   Review of Systems:   General: no fevers, chills, no body weight gain, fatigue HEENT: no blurry vision, hearing changes or sore throat Respiratory: no dyspnea, coughing, wheezing CV: no chest pain, no palpitations GI: no  nausea, vomiting, abdominal pain, diarrhea, constipation GU: no dysuria, burning on urination, increased urinary frequency, hematuria  Ext: no leg edema Neuro: no unilateral weakness, numbness, or tingling, no vision change or hearing loss. Has syncope Skin: no rash, no skin tear. MSK: No muscle spasm, no deformity, no limitation of range of movement in spin Heme: No easy bruising.  Travel history: No recent long distant travel.   Allergy:  Allergies  Allergen Reactions   Atorvastatin Other (See Comments)    Mental status change   Ezetimibe Other (See Comments)    Muscle Pain   Lovastatin Other (See Comments)    Muscle Pain   Amlodipine Cough        Quinapril Cough    Past Medical History:  Diagnosis Date   Allergy    Asthma    Diabetes mellitus without complication ([place])    Gout    Hypertension     Past Surgical History:  Procedure Laterality Date   ABDOMINAL HYSTERECTOMY     BACK SURGERY     COLONOSCOPY     ROTATOR CUFF REPAIR Right     Social History:  reports that she has never smoked. She has never used smokeless tobacco. She reports that she does not drink alcohol and does not use drugs.  Family History:  Family History  Problem Relation Age of Onset   Hypertension Sister      Prior to Admission medications   Medication Sig Start Date End Date Taking? Authorizing Provider  allopurinol (ZYLOPRIM) 100 MG tablet Take 100 mg by mouth at bedtime.  12/04/19  Yes  [provider]  aspirin EC 81 MG tablet Take 81 mg by mouth every Monday, Wednesday, and Friday.   Yes [provider]  baclofen (LIORESAL) 10 MG tablet Take 10 mg by mouth daily. 02/21/20  Yes [provider]  Cyanocobalamin (VITAMIN B-12 PO) Take 1 tablet by mouth 2 (two) times a week.   Yes [provider]  felodipine (PLENDIL) 5 MG 24 hr tablet Take 10 mg by mouth daily. 07/30/22  Yes [provider]  Fluticasone-Salmeterol (ADVAIR) 250-50 MCG/DOSE AEPB  Inhale 1 puff into the lungs every 12 (twelve) hours.   Yes [provider]  gabapentin (NEURONTIN) 100 MG capsule Take 2 capsules by mouth at bedtime. 07/30/22 07/30/23 Yes [provider]  levothyroxine (SYNTHROID) 50 MCG tablet Take 50 mcg by mouth daily before breakfast.   Yes [provider]  telmisartan (MICARDIS) 20 MG tablet Take 20 mg by mouth daily.   Yes [provider]  triamterene-hydrochlorothiazide (DYAZIDE) 37.5-25 MG capsule Take 1 capsule by mouth daily.   Yes [provider]  metFORMIN (GLUCOPHAGE) 500 MG tablet Take 500 mg by mouth at bedtime.    [provider]  montelukast (SINGULAIR) 10 MG tablet Take 10 mg by mouth at bedtime.  Patient not taking: Reported on 08/19/2022 02/22/20   [provider]  niacin (NIASPAN) 500 MG CR tablet Take 500 mg by mouth at bedtime. Patient not taking: Reported on 08/19/2022    [provider]    Physical Exam: Vitals:   08/19/22 1600 08/19/22 1719 08/19/22 1800 08/19/22 1821  BP:  (!) 183/64 (!) 152/129 (!) 175/75  Pulse: 71  67 69  Resp: 17  17 20   Temp:      TempSrc:      SpO2: 97%  100% 97%  Weight:      Height:       General: Not in acute distress HEENT:       Eyes: PERRL, EOMI, no scleral icterus.       ENT: No discharge from the ears and nose, no pharynx injection, no tonsillar enlargement.        Neck: No JVD, no bruit, no mass felt. Heme: No neck lymph node enlargement. Cardiac: S1/S2, RRR, No murmurs, No gallops or rubs. Respiratory: No rales, wheezing, rhonchi or rubs. GI: Soft, nondistended, nontender, no rebound pain, no organomegaly, BS present. GU: No hematuria Ext: No pitting leg edema bilaterally. 1+DP/PT pulse bilaterally. Musculoskeletal: No joint deformities, No joint redness or warmth, no limitation of ROM in spin. Skin: No rashes.  Neuro: Alert, orientated to person and place, confused about time., cranial nerves II-XII grossly intact,  moves all extremities normally.  Psych: Patient is not psychotic, no suicidal or hemocidal ideation.  Labs on Admission: I have personally reviewed following labs and imaging studies  CBC: Recent Labs  Lab 08/18/22 2246  WBC 9.0  HGB 11.2*  HCT 35.7*  MCV 94.9  PLT 174   Basic Metabolic Panel: Recent Labs  Lab 08/18/22 2246 08/19/22 0136  NA 143  --   K 3.9  --   CL 112*  --   CO2 25  --   GLUCOSE 130*  --   BUN 36*  --   CREATININE 2.24*  --   CALCIUM 9.2  --   MG  --  2.0   GFR: Estimated Creatinine Clearance: 20.3 mL/min (A) (by C-G formula based on SCr of 2.24 mg/dL (H)). Liver Function Tests: No results for input(s): "AST", "  ALT", "ALKPHOS", "BILITOT", "PROT", "ALBUMIN" in the last 168 hours. No results for input(s): "LIPASE", "AMYLASE" in the last 168 hours. No results for input(s): "AMMONIA" in the last 168 hours. Coagulation Profile: No results for input(s): "INR", "PROTIME" in the last 168 hours. Cardiac Enzymes: No results for input(s): "CKTOTAL", "CKMB", "CKMBINDEX", "TROPONINI" in the last 168 hours. BNP (last 3 results) No results for input(s): "PROBNP" in the last 8760 hours. HbA1C: No results for input(s): "HGBA1C" in the last 72 hours. CBG: Recent Labs  Lab 08/18/22 2250 08/19/22 1139 08/19/22 1707  GLUCAP 135* 135* 107*   Lipid Profile: No results for input(s): "CHOL", "HDL", "LDLCALC", "TRIG", "CHOLHDL", "LDLDIRECT" in the last 72 hours. Thyroid Function Tests: No results for input(s): "TSH", "T4TOTAL", "FREET4", "T3FREE", "THYROIDAB" in the last 72 hours. Anemia Panel: No results for input(s): "VITAMINB12", "FOLATE", "FERRITIN", "TIBC", "IRON", "RETICCTPCT" in the last 72 hours. Urine analysis:    Component Value Date/Time   COLORURINE YELLOW (A) 08/18/2022 0115   APPEARANCEUR HAZY (A) 08/18/2022 0115   LABSPEC 1.016 08/18/2022 0115   PHURINE 5.0 08/18/2022 0115   GLUCOSEU NEGATIVE 08/18/2022 0115   HGBUR NEGATIVE 08/18/2022 0115    BILIRUBINUR NEGATIVE 08/18/2022 0115   KETONESUR NEGATIVE 08/18/2022 0115   PROTEINUR 100 (A) 08/18/2022 0115   NITRITE NEGATIVE 08/18/2022 0115   LEUKOCYTESUR TRACE (A) 08/18/2022 0115   Sepsis Labs: @LABRCNTIP (procalcitonin:4,lacticidven:4) )No results found for this or any previous visit (from the past 240 hour(s)).   Radiological Exams on Admission: ECHOCARDIOGRAM COMPLETE  Result Date: 08/19/2022    ECHOCARDIOGRAM REPORT   Patient Name:   ISZABELLA HEBENSTREIT Date of Exam: 08/19/2022 Medical Rec #:  [id number]       Height:       62.0 in Accession #:    [id number]      Weight:       200.0 lb Date of Birth:  August 08, 1941       BSA:          1.912 m Patient Age:    70 years        BP:           154/71 mmHg Patient Gender: F               HR:           64 bpm. Exam Location:  [facility] Procedure: 2D Echo, Cardiac Doppler and Color Doppler Indications:     R55 Syncope  History:         Patient has no prior history of Echocardiogram examinations.                  Asthma; Risk Factors:Hypertension and Diabetes.  Sonographer:     Rosalia Hammers Referring Phys:  [id number] Ivor Costa Diagnosing Phys: Yolonda Kida MD IMPRESSIONS  1. Left ventricular ejection fraction, by estimation, is 65 to 70%. The left ventricle has normal function. The left ventricle has no regional wall motion abnormalities. There is mild left ventricular hypertrophy. Left ventricular diastolic parameters are consistent with Grade I diastolic dysfunction (impaired relaxation).  2. Right ventricular systolic function is normal. The right ventricular size is normal.  3. The mitral valve is normal in structure. Trivial mitral valve regurgitation.  4. The aortic valve is normal in structure. Aortic valve regurgitation is not visualized. FINDINGS  Left Ventricle: Left ventricular ejection fraction, by estimation, is 65 to 70%. The left ventricle has normal function. The left ventricle has no regional wall motion abnormalities. The left ventricular  internal cavity size was normal in size. There is  mild left ventricular hypertrophy. Left ventricular diastolic parameters are consistent with Grade I diastolic dysfunction (impaired relaxation). Right Ventricle: The right ventricular size is normal. No increase in right ventricular wall thickness. Right ventricular systolic function is normal. Left Atrium: Left atrial size was normal in size. Right Atrium: Right atrial size was normal in size. Pericardium: There is no evidence of pericardial effusion. Mitral Valve: The mitral valve is normal in structure. Trivial mitral valve regurgitation. Tricuspid Valve: The tricuspid valve is normal in structure. Tricuspid valve regurgitation is trivial. Aortic Valve: The aortic valve is normal in structure. Aortic valve regurgitation is not visualized. Aortic valve mean gradient measures 6.0 mmHg. Aortic valve peak gradient measures 10.9 mmHg. Aortic valve area, by VTI measures 1.87 cm. Pulmonic Valve: The pulmonic valve was grossly normal. Pulmonic valve regurgitation is trivial. Aorta: The ascending aorta was not well visualized. IAS/Shunts: No atrial level shunt detected by color flow Doppler.  LEFT VENTRICLE PLAX 2D LVIDd:         3.90 cm   Diastology LVIDs:         2.50 cm   LV e' medial:    7.72 cm/s LV PW:         1.30 cm   LV E/e' medial:  11.7 LV IVS:        1.10 cm   LV e' lateral:   7.83 cm/s LVOT diam:     2.10 cm   LV E/e' lateral: 11.6 LV SV:         73 LV SV Index:   38 LVOT Area:     3.46 cm  RIGHT VENTRICLE RV Basal diam:  3.20 cm RV Mid diam:    2.80 cm RV S prime:     13.50 cm/s TAPSE (M-mode): 2.6 cm LEFT ATRIUM             Index        RIGHT ATRIUM           Index LA diam:        2.60 cm 1.36 cm/m   RA Area:     14.40 cm LA Vol (A2C):   52.6 ml 27.51 ml/m  RA Volume:   34.50 ml  18.05 ml/m LA Vol (A4C):   52.0 ml 27.20 ml/m LA Biplane Vol: 56.1 ml 29.35 ml/m  AORTIC VALVE                     PULMONIC VALVE AV Area (Vmax):    1.90 cm      PR End  Diast Vel: 5.68 msec AV Area (Vmean):   1.91 cm AV Area (VTI):     1.87 cm AV Vmax:           165.00 cm/s AV Vmean:          113.000 cm/s AV VTI:            0.390 m AV Peak Grad:      10.9 mmHg AV Mean Grad:      6.0 mmHg LVOT Vmax:         90.70 cm/s LVOT Vmean:        62.400 cm/s LVOT VTI:          0.210 m LVOT/AV VTI ratio: 0.54  AORTA Ao Root diam: 3.10 cm MITRAL VALVE MV Area (PHT): 3.54 cm     SHUNTS MV Decel Time: 214 msec  Systemic VTI:  0.21 m MV E velocity: 90.70 cm/s   Systemic Diam: 2.10 cm MV A velocity: 115.00 cm/s MV E/A ratio:  0.79 Dwayne Prince Rome MD Electronically signed by Yolonda Kida MD Signature Date/Time: 08/19/2022/4:26:17 PM    Final    MR BRAIN WO CONTRAST  Result Date: 08/19/2022 CLINICAL DATA:  Syncope/presyncope, cerebrovascular cause suspected. EXAM: MRI HEAD WITHOUT CONTRAST TECHNIQUE: Multiplanar, multiecho pulse sequences of the brain and surrounding structures were obtained without intravenous contrast. COMPARISON:  Head CT 08/18/2022 and MRI 09/06/2020 FINDINGS: Brain: There is no evidence of an acute infarct, mass, midline shift, or extra-axial fluid collection. A chronic left parieto-occipital infarct is again noted with associated hemosiderin deposition and a small amount of adjacent superficial siderosis. Patchy T2 hyperintensities elsewhere in the cerebral white matter bilaterally are similar to the prior MRI and are nonspecific but compatible with moderate chronic small vessel ischemic disease. Mild cerebral atrophy is within normal limits for age. Vascular: Major intracranial vascular flow voids are preserved. Skull and upper cervical spine: No suspicious marrow lesion. Sinuses/Orbits: Bilateral cataract extraction. Paranasal sinuses and mastoid air cells are clear. Other: None. IMPRESSION: 1. No acute intracranial abnormality. 2. Moderate chronic small vessel ischemic disease. 3. Chronic left parieto-occipital infarct. Electronically Signed   By: Logan Bores M.D.   On: 08/19/2022 15:49   DG Chest 2 View  Result Date: 08/18/2022 CLINICAL DATA:  Syncope. EXAM: CHEST - 2 VIEW COMPARISON:  Radiograph 06/15/2020 FINDINGS: Mild cardiomegaly. Unchanged mediastinal contours. Aortic atherosclerosis. There is mild peribronchial thickening. Vascular congestion. Trace fluid in the fissures without large subpulmonic effusion. No focal airspace disease. No pneumothorax. No acute osseous findings. IMPRESSION: 1. Mild cardiomegaly with vascular congestion and trace fluid in the fissures. 2. Mild peribronchial thickening, may be bronchitic or congestive. Electronically Signed   By: Keith Rake M.D.   On: 08/18/2022 23:57   CT Head Wo Contrast  Result Date: 08/18/2022 CLINICAL DATA:  Syncope EXAM: CT HEAD WITHOUT CONTRAST TECHNIQUE: Contiguous axial images were obtained from the base of the skull through the vertex without intravenous contrast. RADIATION DOSE REDUCTION: This exam was performed according to the departmental dose-optimization program which includes automated exposure control, adjustment of the mA and/or kV according to patient size and/or use of iterative reconstruction technique. COMPARISON:  02/28/2020 FINDINGS: Brain: No evidence of acute infarction, hemorrhage, hydrocephalus, extra-axial collection or mass lesion/mass effect. 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 Subcortical white matter and periventricular small vessel ischemic changes. Vascular: Intracranial atherosclerosis. Skull: Normal. Negative for fracture or focal lesion. Sinuses/Orbits: The visualized paranasal sinuses are essentially clear. The mastoid air cells are unopacified. Other: None. IMPRESSION: No acute intracranial abnormality. Old left parietal infarct.  Small vessel ischemic changes. Electronically Signed   By: Julian Hy M.D.   On: 08/18/2022 23:48      Assessment/Plan Principal Problem:   Syncope Active Problems:   Hypertension   CKD  (chronic kidney disease) stage 4, GFR 15-29 ml/min (HCC)   Asthma   Hypothyroidism   Type II diabetes mellitus with renal manifestations (HCC)   Gout   Assessment and Plan: * Syncope Etiology is not clear.  CT head showed old left parietal infarction.  EMS reported positive orthostatic vital sign, but not sure if this is the only reason.  -Placed on telemetry bed observation -Frequent neurochecks -IV fluid: 500 cc normal saline -Check orthostatic vital sign -Follow-up MRI of the brain and 2D echo  Hypertension - IV hydralazine as needed -  Felodipine, Micardis -Hold Dyazide  CKD (chronic kidney disease) stage 4, GFR 15-29 ml/min (HCC) Slightly worsening than baseline.  Recent baseline creatinine 2.0 on 07/30/2022.  Her creatinine is at 2.24, BUN 36, GFR 21. -Hold Dyazide -Patient received 500 cc normal saline  Asthma Stable -Bronchodilators  Hypothyroidism - Synthroid  Type II diabetes mellitus with renal manifestations (HCC) Recent A1c 6.7, well controlled.  Patient is a taking metformin at home -Sliding scale insulin  Gout - Allopurinol          DVT ppx: SQ Lovenox  Code Status: Full code  Family Communication: Yes, patient's daughter and granddaughter   at bed side.     Disposition Plan:  Anticipate discharge back to previous environment  Consults called: None  Admission status and Level of care: Telemetry Medical: for obs     Dispo: The patient is from: Home              Anticipated d/c is to: Home              Anticipated d/c date is: 1 day              Patient currently is not medically stable to d/c.    Severity of Illness:  The appropriate patient status for this patient is OBSERVATION. Observation status is judged to be reasonable and necessary in order to provide the required intensity of service to ensure the patient's safety. The patient's presenting symptoms, physical exam findings, and initial radiographic and laboratory data in the  context of their medical condition is felt to place them at decreased risk for further clinical deterioration. Furthermore, it is anticipated that the patient will be medically stable for discharge from the hospital within 2 midnights of admission.        Date of Service 08/19/2022    [facility] [place] [facility]   If 7PM-7AM, please contact night-coverage [email or web address] 08/19/2022, 6:49 PM

## 2022-08-19 NOTE — Assessment & Plan Note (Signed)
Synthroid 

## 2022-08-19 NOTE — Assessment & Plan Note (Addendum)
The patient did not have any further episodes here in the hospital.  Was feeling okay.  Was discharged home in stable condition.  MRI of the brain was negative.  Echocardiogram showed a normal range.  The patient did not wear her telemetry monitoring here.

## 2022-08-19 NOTE — Assessment & Plan Note (Signed)
Slightly worsening than baseline.  Recent baseline creatinine 2.0 on 07/30/2022.  Her creatinine is at 2.24, BUN 36, GFR 21. -Hold Dyazide -Patient received 500 cc normal saline

## 2022-08-19 NOTE — Assessment & Plan Note (Signed)
-  Felodipine, Micardis -Hold Dyazide The patient has a blood pressure cuff at home and she can take her blood pressure at home.

## 2022-08-19 NOTE — Assessment & Plan Note (Signed)
Stable -Bronchodilators 

## 2022-08-19 NOTE — ED Provider Notes (Signed)
[facility] Provider Note    Event Date/Time   First MD Initiated Contact with Patient 08/19/22 0543     (approximate)   History   Loss of Consciousness   HPI  Maria Cunningham is a 81 y.o. female with history of hypertension, diabetes, asthma, dementia who presents to the emergency department with her daughters after she had a syncopal event just prior to arrival.  Daughter reports she was in her normal state of health and they had just eaten and patient was sitting down and they were having a conversation when the patient passed out and was unconscious for about 5 minutes.  No seizure-like activity, incontinence, tongue biting or postictal state.  She had no complaints prior to this happening.  She has never had an event like this before.  Family reports that she had positive orthostatic vital signs with EMS.  No vomiting or diarrhea.  Has been eating and drinking well today.   History provided by patient and daughters.  History limited due to patient's dementia.    Past Medical History:  Diagnosis Date   Allergy    Asthma    Diabetes mellitus without complication ([place])    Gout    Hypertension     Past Surgical History:  Procedure Laterality Date   ABDOMINAL HYSTERECTOMY     BACK SURGERY     COLONOSCOPY     ROTATOR CUFF REPAIR Right     MEDICATIONS:  Prior to Admission medications   Medication Sig Start Date End Date Taking? Authorizing Provider  allopurinol (ZYLOPRIM) 100 MG tablet Take 100 mg by mouth at bedtime.  12/04/19   [provider]  aspirin EC 81 MG tablet Take 81 mg by mouth every Monday, Wednesday, and Friday.    [provider]  baclofen (LIORESAL) 10 MG tablet Take 10 mg by mouth daily. 02/21/20   [provider]  Cyanocobalamin (VITAMIN B-12 PO) Take 1 tablet by mouth 2 (two) times a week.    [provider]  Fluticasone-Salmeterol (ADVAIR) 250-50 MCG/DOSE AEPB Inhale 1 puff into the lungs every  12 (twelve) hours.    [provider]  levothyroxine (SYNTHROID) 50 MCG tablet Take 50 mcg by mouth daily before breakfast.    [provider]  metFORMIN (GLUCOPHAGE) 500 MG tablet Take 500 mg by mouth at bedtime.    [provider]  montelukast (SINGULAIR) 10 MG tablet Take 10 mg by mouth at bedtime.  02/22/20   [provider]  niacin (NIASPAN) 500 MG CR tablet Take 500 mg by mouth at bedtime.    [provider]  telmisartan (MICARDIS) 20 MG tablet Take 20 mg by mouth daily.    [provider]  triamterene-hydrochlorothiazide (DYAZIDE) 37.5-25 MG capsule Take 1 capsule by mouth daily.    [provider]    Physical Exam   Triage Vital Signs: ED Triage Vitals  Enc Vitals Group     BP 08/18/22 2218 123/85     Pulse Rate 08/18/22 2218 (!) 109     Resp 08/18/22 2218 18     Temp 08/18/22 2218 98.4 F (36.9 C)     Temp Source 08/18/22 2218 Oral     SpO2 08/18/22 2218 95 %     Weight --      Height 08/18/22 2232 5\' 2"  (1.575 m)     Head Circumference --      Peak Flow --      Pain Score 08/18/22 2232  0     Pain Loc --      Pain Edu? --      Excl. in [place]? --     Most recent vital signs: Vitals:   08/19/22 0550 08/19/22 0616  BP: (!) 159/97   Pulse: 68   Resp: 19   Temp:  98 F (36.7 C)  SpO2: 98%     CONSTITUTIONAL: Alert and oriented and responds appropriately to questions. Well-appearing; well-nourished HEAD: Normocephalic, atraumatic EYES: Conjunctivae clear, pupils appear equal, sclera nonicteric ENT: normal nose; moist mucous membranes NECK: Supple, normal ROM CARD: RRR; S1 and S2 appreciated; no murmurs, no clicks, no rubs, no gallops RESP: Normal chest excursion without splinting or tachypnea; breath sounds clear and equal bilaterally; no wheezes, no rhonchi, no rales, no hypoxia or respiratory distress, speaking full sentences ABD/GI: Normal bowel sounds; non-distended; soft, non-tender, no rebound, no  guarding, no peritoneal signs BACK: The back appears normal EXT: Normal ROM in all joints; no deformity noted, no edema; no cyanosis SKIN: Normal color for age and race; warm; no rash on exposed skin NEURO: Moves all extremities equally, normal speech PSYCH: The patient's mood and manner are appropriate.   ED Results / Procedures / Treatments   LABS: (all labs ordered are listed, but only abnormal results are displayed) Labs Reviewed  BASIC METABOLIC PANEL - Abnormal; Notable for the following components:      Result Value   Chloride 112 (*)    Glucose, Bld 130 (*)    BUN 36 (*)    Creatinine, Ser 2.24 (*)    GFR, Estimated 21 (*)    All other components within normal limits  CBC - Abnormal; Notable for the following components:   RBC 3.76 (*)    Hemoglobin 11.2 (*)    HCT 35.7 (*)    All other components within normal limits  URINALYSIS, ROUTINE W REFLEX MICROSCOPIC - Abnormal; Notable for the following components:   Color, Urine YELLOW (*)    APPearance HAZY (*)    Protein, ur 100 (*)    Leukocytes,Ua TRACE (*)    Bacteria, UA RARE (*)    All other components within normal limits  CBG MONITORING, ED - Abnormal; Notable for the following components:   Glucose-Capillary 135 (*)    All other components within normal limits  BRAIN NATRIURETIC PEPTIDE  MAGNESIUM  TROPONIN I (HIGH SENSITIVITY)  TROPONIN I (HIGH SENSITIVITY)     EKG:  EKG Interpretation  Date/Time:  Monday August 18 2022 22:42:50 EDT Ventricular Rate:  71 PR Interval:  180 QRS Duration: 86 QT Interval:  404 QTC Calculation: 439 R Axis:   -2 Text Interpretation: Sinus rhythm with occasional Premature ventricular complexes and Premature atrial complexes Possible Anterior infarct (cited on or before 15-Jun-2020) Abnormal ECG When compared with ECG of 15-Jun-2020 15:21, Premature ventricular complexes are now Present Premature atrial complexes are now Present Confirmed by Pryor Curia [id number]) on  08/19/2022 6:19:33 AM         RADIOLOGY: My personal review and interpretation of imaging: Chest x-ray shows vascular congestion without overt edema.  CT head shows no acute abnormality.  I have personally reviewed all radiology reports.   DG Chest 2 View  Result Date: 08/18/2022 CLINICAL DATA:  Syncope. EXAM: CHEST - 2 VIEW COMPARISON:  Radiograph 06/15/2020 FINDINGS: Mild cardiomegaly. Unchanged mediastinal contours. Aortic atherosclerosis. There is mild peribronchial thickening. Vascular congestion. Trace fluid in the fissures without large subpulmonic effusion. No focal airspace disease. No pneumothorax. No  acute osseous findings. IMPRESSION: 1. Mild cardiomegaly with vascular congestion and trace fluid in the fissures. 2. Mild peribronchial thickening, may be bronchitic or congestive. Electronically Signed   By: Keith Rake M.D.   On: 08/18/2022 23:57   CT Head Wo Contrast  Result Date: 08/18/2022 CLINICAL DATA:  Syncope EXAM: CT HEAD WITHOUT CONTRAST TECHNIQUE: Contiguous axial images were obtained from the base of the skull through the vertex without intravenous contrast. RADIATION DOSE REDUCTION: This exam was performed according to the departmental dose-optimization program which includes automated exposure control, adjustment of the mA and/or kV according to patient size and/or use of iterative reconstruction technique. COMPARISON:  02/28/2020 FINDINGS: Brain: No evidence of acute infarction, hemorrhage, hydrocephalus, extra-axial collection or mass lesion/mass effect. 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 Subcortical white matter and periventricular small vessel ischemic changes. Vascular: Intracranial atherosclerosis. Skull: Normal. Negative for fracture or focal lesion. Sinuses/Orbits: The visualized paranasal sinuses are essentially clear. The mastoid air cells are unopacified. Other: None. IMPRESSION: No acute intracranial abnormality. Old left parietal  infarct.  Small vessel ischemic changes. Electronically Signed   By: Julian Hy M.D.   On: 08/18/2022 23:48     PROCEDURES:  Critical Care performed: No     .1-3 Lead EKG Interpretation  Performed by: Calil Amor, Delice Bison, DO Authorized by: Idara Woodside, Delice Bison, DO     Interpretation: normal     ECG rate:  68   ECG rate assessment: normal     Rhythm: sinus rhythm     Ectopy: PAC and PVCs     Conduction: normal       IMPRESSION / MDM / ASSESSMENT AND PLAN / ED COURSE  I reviewed the triage vital signs and the nursing notes.    Patient here after syncopal event at home without seizure-like activity.  The patient is on the cardiac monitor to evaluate for evidence of arrhythmia and/or significant heart rate changes.   DIFFERENTIAL DIAGNOSIS (includes but not limited to):   Arrhythmia, ACS, PE, dissection, CHF, dehydration, symptomatic anemia, electrolyte derangement, orthostasis   Patient's presentation is most consistent with acute presentation with potential threat to life or bodily function.   PLAN: Labs obtained from triage show creatinine of 2.24.  On comparison of labs in care everywhere creatinine was just recently 2.2 so not far from her baseline.  Blood glucose is normal.  Electrolytes unremarkable.  She has mild anemia which is chronic and stable for her with hemoglobin of 11.2.  Troponin x2 was negative.  Chest x-ray reviewed and interpreted by myself and the radiologist and shows cardiomegaly with vascular congestion and some trace fluid in the fissures.  I did add on a BNP to evaluate for CHF given these chest x-ray findings but BNP is normal.  Urine does not appear grossly infected and shows no ketonuria to suggest significant dehydration.  Head CT was obtained from triage and reviewed and interpreted by myself and the radiologist and shows no acute abnormality.  She had no seizure-like activity or head injury with this episode and is at her neurologic baseline per  family.  She is having frequent PVCs and PACs on cardiac monitoring.  We will continue to monitor closely for arrhythmia and check a magnesium level.  Will discuss with hospitalist for admission.  Family reports that she was orthostatic with EMS however this does not fit her clinical picture given she had a syncopal event while sitting down and had not recently change position per her daughter.  We will gently hydrate here and recheck orthostats after hydration.   MEDICATIONS GIVEN IN ED: Medications  sodium chloride 0.9 % bolus 500 mL (500 mLs Intravenous New Bag/Given 08/19/22 0615)     ED COURSE: Magnesium and repeat orthostats pending.   CONSULTS:  Consulted and discussed patient's case with hospitalist, Dr. Sidney Ace.  I have recommended admission and consulting physician agrees and will place admission orders.  Patient (and family if present) agree with this plan.   I reviewed all nursing notes, vitals, pertinent previous records.  All labs, EKGs, imaging ordered have been independently reviewed and interpreted by myself.    OUTSIDE RECORDS REVIEWED: Reviewed patient's internal medicine visit on 07/30/2022.       FINAL CLINICAL IMPRESSION(S) / ED DIAGNOSES   Final diagnoses:  Syncope, unspecified syncope type     Rx / DC Orders   ED Discharge Orders     None        Note:  This document was prepared using Dragon voice recognition software and may include unintentional dictation errors.   Iline Buchinger, Delice Bison, DO 08/19/22 0630

## 2022-08-19 NOTE — Assessment & Plan Note (Addendum)
Recent A1c 6.7.  Hold metformin with CKD.

## 2022-08-20 DIAGNOSIS — R55 Syncope and collapse: Secondary | ICD-10-CM | POA: Diagnosis not present

## 2022-08-20 DIAGNOSIS — N179 Acute kidney failure, unspecified: Secondary | ICD-10-CM

## 2022-08-20 DIAGNOSIS — N189 Chronic kidney disease, unspecified: Secondary | ICD-10-CM

## 2022-08-20 DIAGNOSIS — E1122 Type 2 diabetes mellitus with diabetic chronic kidney disease: Secondary | ICD-10-CM

## 2022-08-20 DIAGNOSIS — I10 Essential (primary) hypertension: Secondary | ICD-10-CM | POA: Diagnosis not present

## 2022-08-20 DIAGNOSIS — J4520 Mild intermittent asthma, uncomplicated: Secondary | ICD-10-CM | POA: Diagnosis not present

## 2022-08-20 LAB — BASIC METABOLIC PANEL
Anion gap: 7 (ref 5–15)
BUN: 34 mg/dL — ABNORMAL HIGH (ref 8–23)
CO2: 23 mmol/L (ref 22–32)
Calcium: 9.3 mg/dL (ref 8.9–10.3)
Chloride: 112 mmol/L — ABNORMAL HIGH (ref 98–111)
Creatinine, Ser: 1.72 mg/dL — ABNORMAL HIGH (ref 0.44–1.00)
GFR, Estimated: 29 mL/min — ABNORMAL LOW (ref >60)
Glucose, Bld: 89 mg/dL (ref 70–99)
Potassium: 3.9 mmol/L (ref 3.5–5.1)
Sodium: 142 mmol/L (ref 135–145)

## 2022-08-20 LAB — CBG MONITORING, ED: Glucose-Capillary: 84 mg/dL (ref 70–99)

## 2022-08-20 NOTE — Assessment & Plan Note (Signed)
Acute kidney injury on CKD stage IV.  Creatinine 2.24 on presentation down to 1.72 upon discharge.  The patient was given a fluid bolus during the hospital course.  Held Dyazide

## 2022-08-20 NOTE — Discharge Summary (Signed)
Physician Discharge Summary   Patient: Maria Cunningham MRN: [id number] DOB: 06/04/1941  Admit date:     08/19/2022  Discharge date: 08/20/22  Discharge Physician: Loletha Grayer   PCP: Gladstone Lighter, MD   Recommendations at discharge:   Follow-up PCP 5 days  Discharge Diagnoses: Principal Problem:   Syncope Active Problems:   Hypertension   Acute kidney injury superimposed on CKD ([place])   Asthma   Hypothyroidism   Type II diabetes mellitus with renal manifestations [facility])   Gout    Hospital Course: Patient was admitted to the hospital for an observation on 08/19/2022 for syncope.  The patient is not the best historian.  The patient's daughter stated that she was sitting up and started to fall backwards and was out of it for a few minutes.  No loss of bowel or bladder control.  She does not think that she had a seizure.  The patient felt well here in the hospital.  She was not orthostatic.  MRI of the brain without contrast did not show any acute intracranial abnormality.  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  Echocardiogram did show a normal EF.  Assessment and Plan: * Syncope The patient did not have any further episodes here in the hospital.  Was feeling okay.  Was discharged home in stable condition.  MRI of the brain was negative.  Echocardiogram showed a normal range.  The patient did not wear her telemetry monitoring here.  Acute kidney injury superimposed on CKD ([place]) Acute kidney injury on CKD stage IV.  Creatinine 2.24 on presentation down to 1.72 upon discharge.  The patient was given a fluid bolus during the hospital course.  Held Dyazide  Hypertension -Felodipine, Micardis -Hold Dyazide The patient has a blood pressure cuff at home and she can take her blood pressure at home.  Asthma Stable -Bronchodilators  Hypothyroidism - Synthroid  Type II diabetes mellitus with renal manifestations (HCC) Recent A1c 6.7.  Hold metformin with  CKD.  Gout - Allopurinol         Consultants: None Procedures performed: None Disposition: Home Diet recommendation:  Cardiac and Carb modified diet DISCHARGE MEDICATION: Allergies as of 08/20/2022       Reactions   Atorvastatin Other (See Comments)   Mental status change   Ezetimibe Other (See Comments)   Muscle Pain   Lovastatin Other (See Comments)   Muscle Pain   Amlodipine Cough      Quinapril Cough        Medication List     STOP taking these medications    metFORMIN 500 MG tablet Commonly known as: GLUCOPHAGE   montelukast 10 MG tablet Commonly known as: SINGULAIR   niacin 500 MG ER tablet Commonly known as: VITAMIN B3   triamterene-hydrochlorothiazide 37.5-25 MG capsule Commonly known as: DYAZIDE       TAKE these medications    allopurinol 100 MG tablet Commonly known as: ZYLOPRIM Take 100 mg by mouth at bedtime.   aspirin EC 81 MG tablet Take 81 mg by mouth every Monday, Wednesday, and Friday.   baclofen 10 MG tablet Commonly known as: LIORESAL Take 10 mg by mouth daily.   felodipine 5 MG 24 hr tablet Commonly known as: PLENDIL Take 10 mg by mouth daily.   Fluticasone-Salmeterol 250-50 MCG/DOSE Aepb Commonly known as: ADVAIR Inhale 1 puff into the lungs every 12 (twelve) hours.   gabapentin 100 MG capsule Commonly known as: NEURONTIN Take 2 capsules by mouth at bedtime.  Synthroid 50 MCG tablet Generic drug: levothyroxine Take 50 mcg by mouth daily before breakfast.   telmisartan 20 MG tablet Commonly known as: MICARDIS Take 20 mg by mouth daily.   VITAMIN B-12 PO Take 1 tablet by mouth 2 (two) times a week.        Follow-up Information     Gladstone Lighter, MD Follow up in 5 day(s).   Specialty: Internal Medicine Contact information: [place] [address] [phone]                Discharge Exam: Danley Danker Weights   08/19/22 0952  Weight: 90.7 kg   Physical Exam HENT:      Head: Normocephalic.     Mouth/Throat:     Pharynx: No oropharyngeal exudate.  Eyes:     General: Lids are normal.     Conjunctiva/sclera: Conjunctivae normal.  Cardiovascular:     Rate and Rhythm: Normal rate and regular rhythm.     Heart sounds: Normal heart sounds, S1 normal and S2 normal.  Pulmonary:     Breath sounds: No decreased breath sounds, wheezing, rhonchi or rales.  Abdominal:     Palpations: Abdomen is soft.     Tenderness: There is no abdominal tenderness.  Musculoskeletal:     Right lower leg: No swelling.     Left lower leg: No swelling.  Skin:    General: Skin is warm.     Findings: No rash.  Neurological:     Mental Status: She is alert.     Comments: Power 5 out of 5 upper and lower extremities.  Able to straight leg raise.      Condition at discharge: stable  The results of significant diagnostics from this hospitalization (including imaging, microbiology, ancillary and laboratory) are listed below for reference.   Imaging Studies: ECHOCARDIOGRAM COMPLETE  Result Date: 08/19/2022    ECHOCARDIOGRAM REPORT   Patient Name:   Maria Cunningham Date of Exam: 08/19/2022 Medical Rec #:  [id number]       Height:       62.0 in Accession #:    [id number]      Weight:       200.0 lb Date of Birth:  [birth year]       BSA:          1.912 m Patient Age:    84 years        BP:           154/71 mmHg Patient Gender: F               HR:           64 bpm. Exam Location:  [facility] Procedure: 2D Echo, Cardiac Doppler and Color Doppler Indications:     R55 Syncope  History:         Patient has no prior history of Echocardiogram examinations.                  Asthma; Risk Factors:Hypertension and Diabetes.  Sonographer:     Rosalia Hammers Referring Phys:  [id number] Ivor Costa Diagnosing Phys: Yolonda Kida MD IMPRESSIONS  1. Left ventricular ejection fraction, by estimation, is 65 to 70%. The left ventricle has normal function. The left ventricle has no regional wall motion abnormalities.  There is mild left ventricular hypertrophy. Left ventricular diastolic parameters are consistent with Grade I diastolic dysfunction (impaired relaxation).  2. Right ventricular systolic function is normal. The right ventricular size is normal.  3. The mitral valve is normal in structure. Trivial mitral valve regurgitation.  4. The aortic valve is normal in structure. Aortic valve regurgitation is not visualized. FINDINGS  Left Ventricle: Left ventricular ejection fraction, by estimation, is 65 to 70%. The left ventricle has normal function. The left ventricle has no regional wall motion abnormalities. The left ventricular internal cavity size was normal in size. There is  mild left ventricular hypertrophy. Left ventricular diastolic parameters are consistent with Grade I diastolic dysfunction (impaired relaxation). Right Ventricle: The right ventricular size is normal. No increase in right ventricular wall thickness. Right ventricular systolic function is normal. Left Atrium: Left atrial size was normal in size. Right Atrium: Right atrial size was normal in size. Pericardium: There is no evidence of pericardial effusion. Mitral Valve: The mitral valve is normal in structure. Trivial mitral valve regurgitation. Tricuspid Valve: The tricuspid valve is normal in structure. Tricuspid valve regurgitation is trivial. Aortic Valve: The aortic valve is normal in structure. Aortic valve regurgitation is not visualized. Aortic valve mean gradient measures 6.0 mmHg. Aortic valve peak gradient measures 10.9 mmHg. Aortic valve area, by VTI measures 1.87 cm. Pulmonic Valve: The pulmonic valve was grossly normal. Pulmonic valve regurgitation is trivial. Aorta: The ascending aorta was not well visualized. IAS/Shunts: No atrial level shunt detected by color flow Doppler.  LEFT VENTRICLE PLAX 2D LVIDd:         3.90 cm   Diastology LVIDs:         2.50 cm   LV e' medial:    7.72 cm/s LV PW:         1.30 cm   LV E/e' medial:  11.7 LV  IVS:        1.10 cm   LV e' lateral:   7.83 cm/s LVOT diam:     2.10 cm   LV E/e' lateral: 11.6 LV SV:         73 LV SV Index:   38 LVOT Area:     3.46 cm  RIGHT VENTRICLE RV Basal diam:  3.20 cm RV Mid diam:    2.80 cm RV S prime:     13.50 cm/s TAPSE (M-mode): 2.6 cm LEFT ATRIUM             Index        RIGHT ATRIUM           Index LA diam:        2.60 cm 1.36 cm/m   RA Area:     14.40 cm LA Vol (A2C):   52.6 ml 27.51 ml/m  RA Volume:   34.50 ml  18.05 ml/m LA Vol (A4C):   52.0 ml 27.20 ml/m LA Biplane Vol: 56.1 ml 29.35 ml/m  AORTIC VALVE                     PULMONIC VALVE AV Area (Vmax):    1.90 cm      PR End Diast Vel: 5.68 msec AV Area (Vmean):   1.91 cm AV Area (VTI):     1.87 cm AV Vmax:           165.00 cm/s AV Vmean:          113.000 cm/s AV VTI:            0.390 m AV Peak Grad:      10.9 mmHg AV Mean Grad:      6.0 mmHg LVOT Vmax:  90.70 cm/s LVOT Vmean:        62.400 cm/s LVOT VTI:          0.210 m LVOT/AV VTI ratio: 0.54  AORTA Ao Root diam: 3.10 cm MITRAL VALVE MV Area (PHT): 3.54 cm     SHUNTS MV Decel Time: 214 msec     Systemic VTI:  0.21 m MV E velocity: 90.70 cm/s   Systemic Diam: 2.10 cm MV A velocity: 115.00 cm/s MV E/A ratio:  0.79 Dwayne D [place]wood MD Electronically signed by Yolonda Kida MD Signature Date/Time: 08/19/2022/4:26:17 PM    Final    MR BRAIN WO CONTRAST  Result Date: 08/19/2022 CLINICAL DATA:  Syncope/presyncope, cerebrovascular cause suspected. EXAM: MRI HEAD WITHOUT CONTRAST TECHNIQUE: Multiplanar, multiecho pulse sequences of the brain and surrounding structures were obtained without intravenous contrast. COMPARISON:  Head CT 08/18/2022 and MRI 09/06/2020 FINDINGS: Brain: There is no evidence of an acute infarct, mass, midline shift, or extra-axial fluid collection. A chronic left parieto-occipital infarct is again noted with associated hemosiderin deposition and a small amount of adjacent superficial siderosis. Patchy T2 hyperintensities elsewhere  in the cerebral white matter bilaterally are similar to the prior MRI and are nonspecific but compatible with moderate chronic small vessel ischemic disease. Mild cerebral atrophy is within normal limits for age. Vascular: Major intracranial vascular flow voids are preserved. Skull and upper cervical spine: No suspicious marrow lesion. Sinuses/Orbits: Bilateral cataract extraction. Paranasal sinuses and mastoid air cells are clear. Other: None. IMPRESSION: 1. No acute intracranial abnormality. 2. Moderate chronic small vessel ischemic disease. 3. Chronic left parieto-occipital infarct. Electronically Signed   By: Logan Bores M.D.   On: 08/19/2022 15:49   DG Chest 2 View  Result Date: 08/18/2022 CLINICAL DATA:  Syncope. EXAM: CHEST - 2 VIEW COMPARISON:  Radiograph 06/15/2020 FINDINGS: Mild cardiomegaly. Unchanged mediastinal contours. Aortic atherosclerosis. There is mild peribronchial thickening. Vascular congestion. Trace fluid in the fissures without large subpulmonic effusion. No focal airspace disease. No pneumothorax. No acute osseous findings. IMPRESSION: 1. Mild cardiomegaly with vascular congestion and trace fluid in the fissures. 2. Mild peribronchial thickening, may be bronchitic or congestive. Electronically Signed   By: Keith Rake M.D.   On: 08/18/2022 23:57   CT Head Wo Contrast  Result Date: 08/18/2022 CLINICAL DATA:  Syncope EXAM: CT HEAD WITHOUT CONTRAST TECHNIQUE: Contiguous axial images were obtained from the base of the skull through the vertex without intravenous contrast. RADIATION DOSE REDUCTION: This exam was performed according to the departmental dose-optimization program which includes automated exposure control, adjustment of the mA and/or kV according to patient size and/or use of iterative reconstruction technique. COMPARISON:  02/28/2020 FINDINGS: Brain: No evidence of acute infarction, hemorrhage, hydrocephalus, extra-axial collection or mass lesion/mass effect.  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 Subcortical white matter and periventricular small vessel ischemic changes. Vascular: Intracranial atherosclerosis. Skull: Normal. Negative for fracture or focal lesion. Sinuses/Orbits: The visualized paranasal sinuses are essentially clear. The mastoid air cells are unopacified. Other: None. IMPRESSION: No acute intracranial abnormality. Old left parietal infarct.  Small vessel ischemic changes. Electronically Signed   By: Julian Hy M.D.   On: 08/18/2022 23:48    Microbiology: No results found for this or any previous visit.  Labs: CBC: Recent Labs  Lab 08/18/22 2246  WBC 9.0  HGB 11.2*  HCT 35.7*  MCV 94.9  PLT 272   Basic Metabolic Panel: Recent Labs  Lab 08/18/22 2246 08/19/22 0136 08/20/22 0451  NA 143  --  142  K 3.9  --  3.9  CL 112*  --  112*  CO2 25  --  23  GLUCOSE 130*  --  89  BUN 36*  --  34*  CREATININE 2.24*  --  1.72*  CALCIUM 9.2  --  9.3  MG  --  2.0  --    Liver Function Tests: No results for input(s): "AST", "ALT", "ALKPHOS", "BILITOT", "PROT", "ALBUMIN" in the last 168 hours. CBG: Recent Labs  Lab 08/18/22 2250 08/19/22 1139 08/19/22 1707 08/19/22 2139 08/20/22 0803  GLUCAP 135* 135* 107* 93 84    Discharge time spent: greater than 30 minutes.  Signed: Loletha Grayer, MD [facility] 08/20/2022

## 2022-08-20 NOTE — ED Notes (Signed)
Pt continues to pull monitor cords and leads off along with BP cuff.  Daughter now sitting at bedside.  Instead of constantly monitoring, this RN will recheck vitals as per order.

## 2022-10-01 ENCOUNTER — Telehealth: Payer: 59 | Admitting: Family Medicine

## 2022-10-01 DIAGNOSIS — J4541 Moderate persistent asthma with (acute) exacerbation: Secondary | ICD-10-CM

## 2022-10-02 NOTE — Addendum Note (Signed)
Addended by: Perlie Mayo on: 10/02/2022 07:11 AM   Modules accepted: Level of Service

## 2022-10-02 NOTE — Progress Notes (Signed)
Because recent treatment in last 3 days for asthma flare- is not improving, needs to be seen in person, I feel your condition warrants further evaluation and I recommend that you be seen in a face to face visit.   NOTE: There will be NO CHARGE for this eVisit   If you are having a true medical emergency please call 911.      For an urgent face to face visit, [facility] has seven urgent care centers for your convenience:     [facility] [place] Get Driving Directions [phone] [address] [place], [place] [address]    [place]) Get Driving Directions [phone] [place], [place] [address]  [facility] [place] [facility]) Get Driving Directions [phone] [address] [place],  [place]  [address]  [place] [facility] [facility] at [address] Get Driving Directions  [phone] [place],  [place] [address]   [facility] Get Driving Directions [phone] [place], [place], [place] [address]   [facility] Get Driving Directions  [phone] [address] [place], [place] [address]   [facility] [place] Get Driving Directions [phone] [address] [place], [place] [address]  Your MyChart E-visit questionnaire answers were reviewed by a board certified advanced clinical practitioner to complete your personal care plan based on your specific symptoms.  Thank you for using e-Visits.

## 2023-01-29 ENCOUNTER — Inpatient Hospital Stay (HOSPITAL_COMMUNITY): Payer: Medicare Other

## 2023-01-29 ENCOUNTER — Emergency Department (HOSPITAL_BASED_OUTPATIENT_CLINIC_OR_DEPARTMENT_OTHER): Payer: Medicare Other

## 2023-01-29 ENCOUNTER — Observation Stay (HOSPITAL_BASED_OUTPATIENT_CLINIC_OR_DEPARTMENT_OTHER)
Admission: EM | Admit: 2023-01-29 | Discharge: 2023-01-30 | Disposition: A | Discharge status: Home Health | Payer: Medicare Other | Attending: Internal Medicine | Admitting: Internal Medicine

## 2023-01-29 ENCOUNTER — Encounter (HOSPITAL_BASED_OUTPATIENT_CLINIC_OR_DEPARTMENT_OTHER): Payer: Self-pay | Admitting: Pediatrics

## 2023-01-29 ENCOUNTER — Emergency Department (HOSPITAL_COMMUNITY): Payer: Medicare Other

## 2023-01-29 ENCOUNTER — Other Ambulatory Visit: Payer: Self-pay

## 2023-01-29 DIAGNOSIS — J45909 Unspecified asthma, uncomplicated: Secondary | ICD-10-CM | POA: Diagnosis not present

## 2023-01-29 DIAGNOSIS — N184 Chronic kidney disease, stage 4 (severe): Secondary | ICD-10-CM | POA: Diagnosis not present

## 2023-01-29 DIAGNOSIS — I6381 Other cerebral infarction due to occlusion or stenosis of small artery: Principal | ICD-10-CM | POA: Insufficient documentation

## 2023-01-29 DIAGNOSIS — E1122 Type 2 diabetes mellitus with diabetic chronic kidney disease: Secondary | ICD-10-CM | POA: Diagnosis not present

## 2023-01-29 DIAGNOSIS — R1310 Dysphagia, unspecified: Secondary | ICD-10-CM | POA: Insufficient documentation

## 2023-01-29 DIAGNOSIS — R4781 Slurred speech: Secondary | ICD-10-CM

## 2023-01-29 DIAGNOSIS — E1165 Type 2 diabetes mellitus with hyperglycemia: Secondary | ICD-10-CM | POA: Diagnosis not present

## 2023-01-29 DIAGNOSIS — Z7982 Long term (current) use of aspirin: Secondary | ICD-10-CM | POA: Insufficient documentation

## 2023-01-29 DIAGNOSIS — N179 Acute kidney failure, unspecified: Secondary | ICD-10-CM | POA: Diagnosis not present

## 2023-01-29 DIAGNOSIS — Z79899 Other long term (current) drug therapy: Secondary | ICD-10-CM | POA: Insufficient documentation

## 2023-01-29 DIAGNOSIS — I63319 Cerebral infarction due to thrombosis of unspecified middle cerebral artery: Secondary | ICD-10-CM

## 2023-01-29 DIAGNOSIS — R42 Dizziness and giddiness: Principal | ICD-10-CM

## 2023-01-29 DIAGNOSIS — I639 Cerebral infarction, unspecified: Secondary | ICD-10-CM | POA: Diagnosis present

## 2023-01-29 DIAGNOSIS — F0390 Unspecified dementia without behavioral disturbance: Secondary | ICD-10-CM | POA: Diagnosis not present

## 2023-01-29 DIAGNOSIS — E039 Hypothyroidism, unspecified: Secondary | ICD-10-CM | POA: Diagnosis not present

## 2023-01-29 LAB — COMPREHENSIVE METABOLIC PANEL
ALT: 15 U/L (ref 0–44)
AST: 21 U/L (ref 15–41)
Albumin: 4.4 g/dL (ref 3.5–5.0)
Alkaline Phosphatase: 72 U/L (ref 38–126)
Anion gap: 11 (ref 5–15)
BUN: 38 mg/dL — ABNORMAL HIGH (ref 8–23)
CO2: 23 mmol/L (ref 22–32)
Calcium: 10.2 mg/dL (ref 8.9–10.3)
Chloride: 104 mmol/L (ref 98–111)
Creatinine, Ser: 2.21 mg/dL — ABNORMAL HIGH (ref 0.44–1.00)
GFR, Estimated: 22 mL/min — ABNORMAL LOW (ref >60)
Glucose, Bld: 122 mg/dL — ABNORMAL HIGH (ref 70–99)
Potassium: 4.4 mmol/L (ref 3.5–5.1)
Sodium: 138 mmol/L (ref 135–145)
Total Bilirubin: 0.6 mg/dL (ref 0.3–1.2)
Total Protein: 8 g/dL (ref 6.5–8.1)

## 2023-01-29 LAB — CBC
HCT: 34.2 % — ABNORMAL LOW (ref 36.0–46.0)
Hemoglobin: 11.2 g/dL — ABNORMAL LOW (ref 12.0–15.0)
MCH: 29.4 pg (ref 26.0–34.0)
MCHC: 32.7 g/dL (ref 30.0–36.0)
MCV: 89.8 fL (ref 80.0–100.0)
Platelets: 294 10*3/uL (ref 150–400)
RBC: 3.81 MIL/uL — ABNORMAL LOW (ref 3.87–5.11)
RDW: 14.1 % (ref 11.5–15.5)
WBC: 8.8 10*3/uL (ref 4.0–10.5)
nRBC: 0 % (ref 0.0–0.2)

## 2023-01-29 LAB — PROTIME-INR
INR: 1.1 (ref 0.8–1.2)
Prothrombin Time: 13.8 seconds (ref 11.4–15.2)

## 2023-01-29 LAB — DIFFERENTIAL
Abs Immature Granulocytes: 0.06 10*3/uL (ref 0.00–0.07)
Basophils Absolute: 0.1 10*3/uL (ref 0.0–0.1)
Basophils Relative: 1 %
Eosinophils Absolute: 1 10*3/uL — ABNORMAL HIGH (ref 0.0–0.5)
Eosinophils Relative: 11 %
Immature Granulocytes: 1 %
Lymphocytes Relative: 22 %
Lymphs Abs: 1.9 10*3/uL (ref 0.7–4.0)
Monocytes Absolute: 0.5 10*3/uL (ref 0.1–1.0)
Monocytes Relative: 6 %
Neutro Abs: 5.3 10*3/uL (ref 1.7–7.7)
Neutrophils Relative %: 59 %

## 2023-01-29 LAB — APTT: aPTT: 30 seconds (ref 24–36)

## 2023-01-29 LAB — ETHANOL: Alcohol, Ethyl (B): <10 mg/dL (ref <10)

## 2023-01-29 LAB — CBG MONITORING, ED: Glucose-Capillary: 124 mg/dL — ABNORMAL HIGH (ref 70–99)

## 2023-01-29 MED ORDER — SODIUM CHLORIDE 0.9% FLUSH
3.0000 mL | Freq: Once | INTRAVENOUS | Status: AC
  Administered 2023-01-29: 3 mL via INTRAVENOUS
  Filled 2023-01-29: qty 3

## 2023-01-29 MED ORDER — CLOPIDOGREL BISULFATE 75 MG PO TABS
75.0000 mg | ORAL_TABLET | Freq: Every day | ORAL | Status: DC
  Administered 2023-01-30: 75 mg via ORAL
  Filled 2023-01-29: qty 1

## 2023-01-29 MED ORDER — PROCHLORPERAZINE EDISYLATE 10 MG/2ML IJ SOLN: 5.0000 mg | Freq: Four times a day (QID) | INTRAMUSCULAR | Status: DC | PRN

## 2023-01-29 MED ORDER — LACTATED RINGERS IV SOLN: INTRAVENOUS | Status: DC

## 2023-01-29 MED ORDER — ACETAMINOPHEN 325 MG PO TABS: 650.0000 mg | ORAL_TABLET | Freq: Four times a day (QID) | ORAL | Status: DC | PRN

## 2023-01-29 MED ORDER — ATORVASTATIN CALCIUM 40 MG PO TABS
40.0000 mg | ORAL_TABLET | Freq: Every day | ORAL | Status: DC
  Administered 2023-01-30: 40 mg via ORAL
  Filled 2023-01-29: qty 1

## 2023-01-29 MED ORDER — CLOPIDOGREL BISULFATE 300 MG PO TABS
300.0000 mg | ORAL_TABLET | ORAL | Status: AC
  Administered 2023-01-29: 300 mg via ORAL
  Filled 2023-01-29: qty 1

## 2023-01-29 MED ORDER — LEVOTHYROXINE SODIUM 50 MCG PO TABS
50.0000 ug | ORAL_TABLET | Freq: Every day | ORAL | Status: DC
  Administered 2023-01-30: 50 ug via ORAL
  Filled 2023-01-29: qty 1

## 2023-01-29 MED ORDER — POLYETHYLENE GLYCOL 3350 17 G PO PACK: 17.0000 g | PACK | Freq: Every day | ORAL | Status: DC | PRN

## 2023-01-29 MED ORDER — ASPIRIN 81 MG PO TBEC
81.0000 mg | DELAYED_RELEASE_TABLET | Freq: Every day | ORAL | Status: DC
  Administered 2023-01-30: 81 mg via ORAL
  Filled 2023-01-29: qty 1

## 2023-01-29 MED ORDER — MELATONIN 5 MG PO TABS: 5.0000 mg | ORAL_TABLET | Freq: Every evening | ORAL | Status: DC | PRN

## 2023-01-29 MED ORDER — ASPIRIN 81 MG PO CHEW
81.0000 mg | CHEWABLE_TABLET | Freq: Once | ORAL | Status: AC
  Administered 2023-01-29: 81 mg via ORAL
  Filled 2023-01-29: qty 1

## 2023-01-29 NOTE — ED Provider Notes (Signed)
[facility] Provider Note   CSN: [id number] Arrival date & time: 01/29/23  1340     History  Chief Complaint  Patient presents with   Dizziness    Maria Cunningham is a 82 y.o. female.  HPI   82 year old female presents emergency department accompanied by her daughter for concern of episodes of slurred speech, dizziness and difficulty walking.  Daughter states that this originally happened on Sunday, she has had multiple episodes since then however symptoms seem to resolve.  But this morning her symptoms were the most severe, her speech was extremely slurred and she was unable to walk secondary to the dizziness.  The patient is a poor historian, does have history of dementia.  Currently states that she feels generally weak, mild headache but speech seems to be back to baseline.  No recent fever, illness.    Home Medications Prior to Admission medications   Medication Sig Start Date End Date Taking? Authorizing Provider  allopurinol (ZYLOPRIM) 100 MG tablet Take 100 mg by mouth at bedtime.  12/04/19   [provider]  aspirin EC 81 MG tablet Take 81 mg by mouth every Monday, Wednesday, and Friday.    [provider]  baclofen (LIORESAL) 10 MG tablet Take 10 mg by mouth daily. 02/21/20   [provider]  Cyanocobalamin (VITAMIN B-12 PO) Take 1 tablet by mouth 2 (two) times a week.    [provider]  felodipine (PLENDIL) 5 MG 24 hr tablet Take 10 mg by mouth daily. 07/30/22   [provider]  Fluticasone-Salmeterol (ADVAIR) 250-50 MCG/DOSE AEPB Inhale 1 puff into the lungs every 12 (twelve) hours.    [provider]  gabapentin (NEURONTIN) 100 MG capsule Take 2 capsules by mouth at bedtime. 07/30/22 07/30/23  [provider]  levothyroxine (SYNTHROID) 50 MCG tablet Take 50 mcg by mouth daily before breakfast.    [provider]  telmisartan (MICARDIS) 20 MG tablet Take 20 mg  by mouth daily.    [provider]      Allergies    Atorvastatin, Ezetimibe, Lovastatin, Amlodipine, and Quinapril    Review of Systems   Review of Systems  Constitutional:  Negative for fever.  Respiratory:  Negative for shortness of breath.   Cardiovascular:  Negative for chest pain.  Gastrointestinal:  Negative for abdominal pain, diarrhea and vomiting.  Skin:  Negative for rash.  Neurological:  Positive for dizziness, speech difficulty and weakness. Negative for facial asymmetry and headaches.    Physical Exam Updated Vital Signs BP (!) 177/63   Pulse 62   Temp (!) 97.5 F (36.4 C) (Oral)   Resp 15   Ht 5\' 5"  (1.651 m)   Wt 91.6 kg   SpO2 98%   BMI 33.61 kg/m  Physical Exam Vitals and nursing note reviewed.  Constitutional:      General: She is not in acute distress.    Appearance: Normal appearance.  HENT:     Head: Normocephalic.     Mouth/Throat:     Mouth: Mucous membranes are moist.  Eyes:     Extraocular Movements: Extraocular movements intact.     Pupils: Pupils are equal, round, and reactive to light.  Cardiovascular:     Rate and Rhythm: Normal rate.  Pulmonary:     Effort: Pulmonary effort is normal. No respiratory distress.  Abdominal:     Palpations: Abdomen is soft.     Tenderness: There is no abdominal tenderness.  Skin:    General: Skin is warm.  Neurological:     General: No focal deficit present.     Mental Status: She is alert and oriented to person, place, and time. Mental status is at baseline.  Psychiatric:        Mood and Affect: Mood normal.     ED Results / Procedures / Treatments   Labs (all labs ordered are listed, but only abnormal results are displayed) Labs Reviewed  CBC - Abnormal; Notable for the following components:      Result Value   RBC 3.81 (*)    Hemoglobin 11.2 (*)    HCT 34.2 (*)    All other components within normal limits  DIFFERENTIAL - Abnormal; Notable for the following components:    Eosinophils Absolute 1.0 (*)    All other components within normal limits  COMPREHENSIVE METABOLIC PANEL - Abnormal; Notable for the following components:   Glucose, Bld 122 (*)    BUN 38 (*)    Creatinine, Ser 2.21 (*)    GFR, Estimated 22 (*)    All other components within normal limits  CBG MONITORING, ED - Abnormal; Notable for the following components:   Glucose-Capillary 124 (*)    All other components within normal limits  PROTIME-INR  APTT  ETHANOL  URINALYSIS, ROUTINE W REFLEX MICROSCOPIC    EKG EKG Interpretation  Date/Time:  Thursday January 29 2023 13:49:09 EDT Ventricular Rate:  67 PR Interval:  199 QRS Duration: 80 QT Interval:  372 QTC Calculation: 393 R Axis:   22 Text Interpretation: Sinus rhythm Confirmed by Lavenia Atlas [phone]) on 01/29/2023 2:02:37 PM  Radiology CT HEAD WO CONTRAST  Result Date: 01/29/2023 CLINICAL DATA:  Syncope/presyncope, cerebrovascular cause suspected. Dizziness and slurred speech. EXAM: CT HEAD WITHOUT CONTRAST TECHNIQUE: Contiguous axial images were obtained from the base of the skull through the vertex without intravenous contrast. RADIATION DOSE REDUCTION: This exam was performed according to the departmental dose-optimization program which includes automated exposure control, adjustment of the mA and/or kV according to patient size and/or use of iterative reconstruction technique. COMPARISON:  Head CT 08/18/2022.  MRI brain 08/19/2022. FINDINGS: Brain: No acute hemorrhage. Unchanged chronic small-vessel disease and old infarct in the left parietal lobe. Cortical gray-white differentiation is otherwise preserved. Prominence of the ventricles and sulci within normal limits for age. No extra-axial collection. Basilar cisterns are patent. Vascular: No hyperdense vessel or unexpected calcification. Skull: No calvarial fracture or suspicious bone lesion. Skull base is unremarkable. Sinuses/Orbits: Unremarkable. Other: None. IMPRESSION: 1. No  acute intracranial abnormality. 2. Unchanged chronic small-vessel disease and old infarct in the left parietal lobe. Electronically Signed   By: Emmit Alexanders M.D.   On: 01/29/2023 14:16    Procedures Procedures    Medications Ordered in ED Medications  sodium chloride flush (NS) 0.9 % injection 3 mL (3 mLs Intravenous Given 01/29/23 1445)    ED Course/ Medical Decision Making/ A&P                             Medical Decision Making Amount and/or Complexity of Data Reviewed Labs: ordered. Radiology: ordered.   82 year old female presents the emergency department with episodes of dizziness, slurred speech, most severe this morning.  Patient was unable to walk.  On my evaluation she complains of mild headache, is slightly hypertensive but appears nonfocal on exam.  Speech is slow but not slurred.  Workup here shows baseline findings, slightly  worse CKD.  Urinalysis still pending.  CT of the head shows chronic findings.  However given recurrent episodes and pronounced slurred speech/difficulty walking/dizziness with ongoing headache we will plan for transfer to [facility] for MRI by private vehicle.  The daughter will drive her.  [facility] physicians at [facility] are excepting.  Patient stable at time of transfer.        Final Clinical Impression(s) / ED Diagnoses Final diagnoses:  Dizziness  Slurred speech    Rx / DC Orders ED Discharge Orders     None         Lorelle Gibbs, DO 01/29/23 1539

## 2023-01-29 NOTE — H&P (Addendum)
History and Physical  Maria Cunningham [id number] DOB: 05-02-1941 DOA: 01/29/2023  Referring physician: Dr. Philip Aspen, [place]. PCP: Gladstone Lighter, MD  Outpatient Specialists: Neurology, cardiology, ophthalmology. Patient coming from: Home.  Chief Complaint: Dizziness, slurred speech.  HPI: Maria Cunningham is a 82 y.o. female with medical history significant for dementia, type 2 diabetes, CKD 4, hypothyroidism, obesity, who presented to [facility] ED due to intermittent dizziness for the past 2 weeks, worse yesterday and associated with slurring of her speech.  Initially went to [facility] ED had a CT scan there that was nonacute.  She was sent to [facility] ED for further evaluation and to rule out a stroke.  MRI brain revealed 2.5 cm acute ischemic nonhemorrhagic right pontine infarct.  Chronic hemorrhagic left occipital infarct.  Underlying age-related cerebral atrophy with chronic microvascular ischemic disease.  EDP discussed the case with neurology Dr. Leonel Ramsay.  Recommended to initiate aspirin and Plavix and to admit for stroke workup.  The patient was admitted by [facility], hospitalist service.  The patient has dementia and is unable to provide a reliable history.  Her daughter is in the room with her.  ED Course: Tmax 97.6.  BP 152/59, pulse 63, respiratory 18, O2 saturation 100% on room air.  Lab studies remarkable for serum glucose 122, BUN 38, creatinine 2.21, baseline creatinine 1.72 with GFR 29.  Hemoglobin 11.2, at baseline.  Review of Systems: Review of systems as noted in the HPI. All other systems reviewed and are negative.   Past Medical History:  Diagnosis Date   Allergy    Asthma    Diabetes mellitus without complication ([place])    Gout    Hypertension    Past Surgical History:  Procedure Laterality Date   ABDOMINAL HYSTERECTOMY     BACK SURGERY     COLONOSCOPY     ROTATOR CUFF REPAIR Right     Social History:  reports that she has never smoked. She has never used smokeless tobacco.  She reports that she does not drink alcohol and does not use drugs.   Allergies  Allergen Reactions   Atorvastatin Other (See Comments)    Mental status change   Ezetimibe Other (See Comments)    Muscle Pain   Lovastatin Other (See Comments)    Muscle Pain   Amlodipine Cough        Quinapril Cough    Family History  Problem Relation Age of Onset   Hypertension Sister       Prior to Admission medications   Medication Sig Start Date End Date Taking? Authorizing Provider  allopurinol (ZYLOPRIM) 100 MG tablet Take 100 mg by mouth at bedtime.  12/04/19   [provider]  aspirin EC 81 MG tablet Take 81 mg by mouth every Monday, Wednesday, and Friday.    [provider]  baclofen (LIORESAL) 10 MG tablet Take 10 mg by mouth daily. 02/21/20   [provider]  Cyanocobalamin (VITAMIN B-12 PO) Take 1 tablet by mouth 2 (two) times a week.    [provider]  felodipine (PLENDIL) 5 MG 24 hr tablet Take 10 mg by mouth daily. 07/30/22   [provider]  Fluticasone-Salmeterol (ADVAIR) 250-50 MCG/DOSE AEPB Inhale 1 puff into the lungs every 12 (twelve) hours.    [provider]  gabapentin (NEURONTIN) 100 MG capsule Take 2 capsules by mouth at bedtime. 07/30/22 07/30/23  [provider]  levothyroxine (SYNTHROID) 50 MCG tablet Take 50 mcg by mouth daily before breakfast.    [provider]  telmisartan (MICARDIS) 20 MG tablet Take 20 mg by mouth daily.    [provider]    Physical Exam: BP (!) 143/68 (BP Location: Left Arm)   Pulse 67   Temp 97.6 F (36.4 C)   Resp 20   Ht 5\' 5"  (1.651 m)   Wt 91.6 kg   SpO2 95%   BMI 33.61 kg/m   General: 82 y.o. year-old female well developed well nourished in no acute distress.  Alert and interactive in the setting of dementia. Cardiovascular: Regular rate and rhythm with no rubs or gallops.  No thyromegaly or JVD noted.  No lower extremity edema. 2/4 pulses in all 4  extremities. Respiratory: Clear to auscultation with no wheezes or rales. Good inspiratory effort. Abdomen: Soft nontender nondistended with normal bowel sounds x4 quadrants. Muskuloskeletal: No cyanosis, clubbing or edema noted bilaterally Neuro: CN II-XII intact, strength, sensation, reflexes Skin: No ulcerative lesions noted or rashes Psychiatry: Mood is appropriate for condition and setting          Labs on Admission:  Basic Metabolic Panel: Recent Labs  Lab 01/29/23 1350  NA 138  K 4.4  CL 104  CO2 23  GLUCOSE 122*  BUN 38*  CREATININE 2.21*  CALCIUM 10.2   Liver Function Tests: Recent Labs  Lab 01/29/23 1350  AST 21  ALT 15  ALKPHOS 72  BILITOT 0.6  PROT 8.0  ALBUMIN 4.4   No results for input(s): "LIPASE", "AMYLASE" in the last 168 hours. No results for input(s): "AMMONIA" in the last 168 hours. CBC: Recent Labs  Lab 01/29/23 1350  WBC 8.8  NEUTROABS 5.3  HGB 11.2*  HCT 34.2*  MCV 89.8  PLT 294   Cardiac Enzymes: No results for input(s): "CKTOTAL", "CKMB", "CKMBINDEX", "TROPONINI" in the last 168 hours.  BNP (last 3 results) Recent Labs    08/18/22 2246  BNP 60.8    ProBNP (last 3 results) No results for input(s): "PROBNP" in the last 8760 hours.  CBG: Recent Labs  Lab 01/29/23 1356  GLUCAP 124*    Radiological Exams on Admission: MR BRAIN WO CONTRAST  Result Date: 01/29/2023 CLINICAL DATA:  Initial evaluation for neuro deficit, stroke suspected. EXAM: MRI HEAD WITHOUT CONTRAST TECHNIQUE: Multiplanar, multiecho pulse sequences of the brain and surrounding structures were obtained without intravenous contrast. COMPARISON:  CT from earlier the same day. FINDINGS: Brain: Cerebral volume within normal limits for age. Patchy T2/FLAIR hyperintensity involving the periventricular deep white matter both cerebral hemispheres, consistent with chronic small vessel ischemic disease. Encephalomalacia and gliosis with chronic hemosiderin staining at the  left occipital lobe, consistent with a chronic hemorrhagic infarct. 2.5 cm linear focus of restricted diffusion involving the paramedian right pons, consistent with an acute ischemic infarct (series 2, image 16). No associated hemorrhage or mass effect. No other evidence for acute or subacute ischemia. Gray-white matter differentiation otherwise maintained. No other acute or chronic intracranial blood products. No mass lesion, midline shift or mass effect. No hydrocephalus or extra-axial fluid collection. Pituitary gland and suprasellar region within normal limits. Vascular: Major intracranial vascular flow voids are grossly maintained at the skull base. Skull and upper cervical spine: Craniocervical junction within normal limits. Diffuse loss of normal bone marrow signal, nonspecific but can be seen with anemia, smoking, obesity, and infiltrative/myelofibrotic marrow processes. No visible focal marrow replacing lesion. No scalp soft tissue abnormality. Sinuses/Orbits: Prior bilateral ocular lens replacement. Paranasal sinuses are largely clear. No significant mastoid effusion. Other: None. IMPRESSION: 1. 2.5  cm acute ischemic nonhemorrhagic right pontine infarct. 2. Chronic hemorrhagic left occipital infarct. 3. Underlying age-related cerebral atrophy with chronic microvascular ischemic disease. Electronically Signed   By: Jeannine Boga M.D.   On: 01/29/2023 19:38   CT HEAD WO CONTRAST  Result Date: 01/29/2023 CLINICAL DATA:  Syncope/presyncope, cerebrovascular cause suspected. Dizziness and slurred speech. EXAM: CT HEAD WITHOUT CONTRAST TECHNIQUE: Contiguous axial images were obtained from the base of the skull through the vertex without intravenous contrast. RADIATION DOSE REDUCTION: This exam was performed according to the departmental dose-optimization program which includes automated exposure control, adjustment of the mA and/or kV according to patient size and/or use of iterative reconstruction  technique. COMPARISON:  Head CT 08/18/2022.  MRI brain 08/19/2022. FINDINGS: Brain: No acute hemorrhage. Unchanged chronic small-vessel disease and old infarct in the left parietal lobe. Cortical gray-white differentiation is otherwise preserved. Prominence of the ventricles and sulci within normal limits for age. No extra-axial collection. Basilar cisterns are patent. Vascular: No hyperdense vessel or unexpected calcification. Skull: No calvarial fracture or suspicious bone lesion. Skull base is unremarkable. Sinuses/Orbits: Unremarkable. Other: None. IMPRESSION: 1. No acute intracranial abnormality. 2. Unchanged chronic small-vessel disease and old infarct in the left parietal lobe. Electronically Signed   By: Emmit Alexanders M.D.   On: 01/29/2023 14:16    EKG: I independently viewed the EKG done and my findings are as followed: Normal sinus rhythm rate of 67.  Nonspecific ST-T changes.  QTc 393.  Assessment/Plan Present on Admission:  CVA (cerebral vascular accident) ([place])  Principal Problem:   CVA (cerebral vascular accident) ([place])  Acute ischemic CVA MRI brain revealed 2.5 cm acute ischemic nonhemorrhagic right pontine infarct.  Chronic hemorrhagic left occipital infarct.  Underlying age-related cerebral atrophy with chronic microvascular ischemic disease.  EDP discussed the case with neurology Dr. Leonel Ramsay.  Recommended to initiate aspirin and Plavix and to admit for stroke workup. Noncontrast CT head completed, MRI brain completed. MRA head and neck pending. Aspirin and Plavix started in the ED. Add high intensity statin, Lipitor 40 mg daily Frequent neurochecks, 2D echo with bubble study Fasting lipid panel, A1c. Close monitoring on telemetry PT/OT/speech therapist evaluation Fall precautions, aspiration precautions N.p.o. until passes a swallow evaluation  AKI on CKD 4, suspect prerenal in the setting of dehydration Baseline creatinine appears to be 1.7 with GFR 29 Presented  with creatinine of 2.21 with GFR 22 Avoid nephrotoxic agents, dehydration and hypotension Gentle IV fluid hydration LR at 50 cc/h x 2 days. Monitor urine output Repeat renal function test in the morning.  Hypertension BP is not at goal, elevated Defer to neurology for goal BP in the setting of chronic hemorrhagic left occipital infarct Closely monitor vital signs.  Hypothyroidism Resume home levothyroxine  Dementia Reorient as needed  Hyperglycemia Mild hyperglycemia, serum glucose 122. Obtain A1c Hold off insulin for now to avoid hypoglycemia.      DVT prophylaxis: SCDs.  Defer pharmacologic DVT prophylaxis to neurology in the setting of chronic hemorrhagic left occipital infarct.  Code Status: Full code.  Daughter at bedside.  Family Communication: Updated patient's daughter at bedside who is also medical power of attorney  Disposition Plan: Admitted to telemetry medical unit  Consults called: Neurology consulted by EDP.  Admission status: Inpatient status.   Status is: Inpatient The patient requires at least 2 midnights for further evaluation and treatment of present condition.   Kayleen Memos MD [facility] Pager [phone]  If 7PM-7AM, please contact night-coverage [email or web address] Password [email or web address]  01/29/2023,  9:42 PM

## 2023-01-29 NOTE — ED Provider Notes (Signed)
Patient transferred from [facility] for evaluation of stroke.  Has had dizziness over several days.  Nonfocal neurologic exam for me but did not walk the patient.  No subjective neurodeficits at this time.  MRI here shows 2.5 cm acute right pontine infarct.  Discussed with Dr. Leonel Ramsay who recommended MRA of the head and neck and starting the patient on aspirin and Plavix and admission to the hospital.   Fransico Meadow, MD 01/29/23 2125

## 2023-01-29 NOTE — ED Notes (Signed)
IV removed per provider as MRI does not require contrast.

## 2023-01-29 NOTE — ED Triage Notes (Signed)
Reports episode of dizziness and slurred speech initially started afternoon; it improved, and then again Tuesday, Wednesday and then again this morning and has improved. Able to follow commands, and speech is clear. Denies unilateral deficits.

## 2023-01-29 NOTE — ED Notes (Signed)
  Pt going to [facility] for MRI, discussed process, report called, pt v/u

## 2023-01-30 ENCOUNTER — Inpatient Hospital Stay (HOSPITAL_BASED_OUTPATIENT_CLINIC_OR_DEPARTMENT_OTHER): Payer: Medicare Other

## 2023-01-30 DIAGNOSIS — I6389 Other cerebral infarction: Secondary | ICD-10-CM | POA: Diagnosis not present

## 2023-01-30 DIAGNOSIS — I63532 Cerebral infarction due to unspecified occlusion or stenosis of left posterior cerebral artery: Secondary | ICD-10-CM | POA: Diagnosis not present

## 2023-01-30 DIAGNOSIS — R4781 Slurred speech: Secondary | ICD-10-CM

## 2023-01-30 DIAGNOSIS — R42 Dizziness and giddiness: Secondary | ICD-10-CM | POA: Diagnosis not present

## 2023-01-30 DIAGNOSIS — I6302 Cerebral infarction due to thrombosis of basilar artery: Secondary | ICD-10-CM

## 2023-01-30 DIAGNOSIS — I671 Cerebral aneurysm, nonruptured: Secondary | ICD-10-CM

## 2023-01-30 LAB — COMPREHENSIVE METABOLIC PANEL
ALT: 15 U/L (ref 0–44)
AST: 17 U/L (ref 15–41)
Albumin: 3.3 g/dL — ABNORMAL LOW (ref 3.5–5.0)
Alkaline Phosphatase: 73 U/L (ref 38–126)
Anion gap: 8 (ref 5–15)
BUN: 34 mg/dL — ABNORMAL HIGH (ref 8–23)
CO2: 22 mmol/L (ref 22–32)
Calcium: 9 mg/dL (ref 8.9–10.3)
Chloride: 107 mmol/L (ref 98–111)
Creatinine, Ser: 1.96 mg/dL — ABNORMAL HIGH (ref 0.44–1.00)
GFR, Estimated: 25 mL/min — ABNORMAL LOW (ref >60)
Glucose, Bld: 85 mg/dL (ref 70–99)
Potassium: 3.8 mmol/L (ref 3.5–5.1)
Sodium: 137 mmol/L (ref 135–145)
Total Bilirubin: 0.5 mg/dL (ref 0.3–1.2)
Total Protein: 6.6 g/dL (ref 6.5–8.1)

## 2023-01-30 LAB — CBC
HCT: 31 % — ABNORMAL LOW (ref 36.0–46.0)
Hemoglobin: 10.4 g/dL — ABNORMAL LOW (ref 12.0–15.0)
MCH: 30.1 pg (ref 26.0–34.0)
MCHC: 33.5 g/dL (ref 30.0–36.0)
MCV: 89.9 fL (ref 80.0–100.0)
Platelets: 267 10*3/uL (ref 150–400)
RBC: 3.45 MIL/uL — ABNORMAL LOW (ref 3.87–5.11)
RDW: 14.2 % (ref 11.5–15.5)
WBC: 7.8 10*3/uL (ref 4.0–10.5)
nRBC: 0 % (ref 0.0–0.2)

## 2023-01-30 LAB — ECHOCARDIOGRAM COMPLETE BUBBLE STUDY
AR max vel: 1.7 cm2
AV Area VTI: 1.99 cm2
AV Area mean vel: 1.57 cm2
AV Mean grad: 5 mmHg
AV Peak grad: 8.8 mmHg
Ao pk vel: 1.48 m/s
Area-P 1/2: 2.1 cm2
S' Lateral: 2.7 cm
Single Plane A4C EF: 59.5 %

## 2023-01-30 LAB — LIPID PANEL
Cholesterol: 232 mg/dL — ABNORMAL HIGH (ref 0–200)
HDL: 51 mg/dL (ref >40)
LDL Cholesterol: 162 mg/dL — ABNORMAL HIGH (ref 0–99)
Total CHOL/HDL Ratio: 4.5 RATIO
Triglycerides: 96 mg/dL (ref <150)
VLDL: 19 mg/dL (ref 0–40)

## 2023-01-30 LAB — PHOSPHORUS: Phosphorus: 3.6 mg/dL (ref 2.5–4.6)

## 2023-01-30 LAB — GLUCOSE, CAPILLARY
Glucose-Capillary: 101 mg/dL — ABNORMAL HIGH (ref 70–99)
Glucose-Capillary: 121 mg/dL — ABNORMAL HIGH (ref 70–99)

## 2023-01-30 LAB — MAGNESIUM: Magnesium: 2.1 mg/dL (ref 1.7–2.4)

## 2023-01-30 MED ORDER — CLOPIDOGREL BISULFATE 75 MG PO TABS: 75.0000 mg | ORAL_TABLET | Freq: Every day | ORAL | 0 refills | Status: AC

## 2023-01-30 MED ORDER — ATORVASTATIN CALCIUM 40 MG PO TABS: 40.0000 mg | ORAL_TABLET | Freq: Every day | ORAL | 0 refills | Status: DC

## 2023-01-30 NOTE — Discharge Summary (Signed)
Physician Discharge Summary  Maria Cunningham [id number] DOB: [birth year] DOA: 01/29/2023  PCP: Gladstone Lighter, MD  Admit date: 01/29/2023 Discharge date: 01/30/2023  Admitted From: Home Disposition: Home with home health PT  Recommendations for Outpatient Follow-up:  Follow up with PCP in 1-2 weeks Neurology follow-up as outpatient.  Will send referral.  Home Health: PT Equipment/Devices: None, family will arrange  Discharge Condition: Stable CODE STATUS: Full code Diet recommendation: Low-salt diet  Discharge summary: 82 year old female with history of dementia, type 2 diabetes, CKD stage IV, hypothyroidism who lives at home with her daughter presented to the emergency room with intermittent dizziness for past 2 weeks, slurring of speech.  Nonspecific symptoms.  CT scan of the head was essentially normal.  MRI revealed 2.5 cm acute ischemic nonhemorrhagic right pontine stroke.  Admitted with neurology consultation.  Hemodynamically stable in the ER.  Admitted for stroke workup.  # Acute ischemic stroke: Clinical findings, dizziness and lightheadedness, slurring of speech.  Already improving. CT head findings, no acute findings. 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  Chronic hemorrhagic left occipital infarct. MR angiogram head and neck, no large vessel occlusion.  Atherosclerotic changes, severe stenosis proximal cavernous segment.  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  Neck MRA without any significant obstruction. 2D echocardiogram with bubble study, no source of thrombus. Antiplatelet therapy, none at home.  Started on aspirin and Plavix.  Plavix loaded. Neurology recommended to continue aspirin.  Plavix for 21 days. LDL 162, started on atorvastatin 40 mg daily.  Previous allergy mentioned as mental status changes likely miss characterized.  She tolerated atorvastatin in the hospital today. Hemoglobin A1c, pending.  Will follow-up. Therapy  recommendations, home health PT.   Acute kidney injury on chronic kidney disease stage IV: Treated with IV fluids.  Back to her normal self.  Encourage oral intake.   Chronic medical issues  Essential hypertension, blood pressure stable.  Resume home medications. Hypothyroidism, on levothyroxine, continue. Dementia, fairly stable.  Lives at home and has adequate support system at home.  Patient was admitted for stroke workup.  She had all her investigations done today.  She did rapid clinical improvement.  She has adequate support system at home.  Able to go home today.  Discharge Diagnoses:  Principal Problem:   CVA (cerebral vascular accident) [facility])    Discharge Instructions  Discharge Instructions     Diet - low sodium heart healthy   Complete by: As directed    Increase activity slowly   Complete by: As directed       Allergies as of 01/30/2023       Reactions   Ezetimibe Other (See Comments)   Muscle Pain   Lovastatin Other (See Comments)   Muscle Pain   Amlodipine Cough      Quinapril Cough        Medication List     STOP taking these medications    benzonatate 200 MG capsule Commonly known as: TESSALON   methocarbamol 500 MG tablet Commonly known as: ROBAXIN       TAKE these medications    allopurinol 100 MG tablet Commonly known as: ZYLOPRIM Take 100 mg by mouth daily.   aspirin EC 81 MG tablet Take 81 mg by mouth daily.   atorvastatin 40 MG tablet Commonly known as: LIPITOR Take 1 tablet (40 mg total) by mouth daily. Start taking on: January 31, 2023   clopidogrel 75 MG tablet Commonly known as: PLAVIX Take 1 tablet (75  mg total) by mouth daily for 21 days. Start taking on: January 31, 2023   donepezil 5 MG tablet Commonly known as: ARICEPT Take 5 mg by mouth daily.   felodipine 5 MG 24 hr tablet Commonly known as: PLENDIL Take 10 mg by mouth daily.   Fluticasone-Salmeterol 250-50 MCG/DOSE Aepb Commonly known as: ADVAIR Inhale 1  puff into the lungs in the morning and at bedtime.   gabapentin 100 MG capsule Commonly known as: NEURONTIN Take 200 mg by mouth at bedtime.   isosorbide mononitrate 30 MG 24 hr tablet Commonly known as: IMDUR Take 30 mg by mouth daily.   Synthroid 50 MCG tablet Generic drug: levothyroxine Take 50 mcg by mouth daily before breakfast.   telmisartan-hydrochlorothiazide 40-12.5 MG tablet Commonly known as: MICARDIS HCT Take 1 tablet by mouth daily.        Follow-up Information     [facility]   Specialty: Home Health Services Contact information: [address] [place] [address] [phone]                Allergies  Allergen Reactions   Ezetimibe Other (See Comments)    Muscle Pain   Lovastatin Other (See Comments)    Muscle Pain   Amlodipine Cough        Quinapril Cough    Consultations: Neurology   Procedures/Studies: ECHOCARDIOGRAM COMPLETE BUBBLE STUDY  Result Date: 01/30/2023    ECHOCARDIOGRAM REPORT   Patient Name:   Maria Cunningham Date of Exam: 01/30/2023 Medical Rec #:  [id number]       Height:       65.0 in Accession #:    [id number]      Weight:       202.0 lb Date of Birth:  [birth year]       BSA:          1.987 m Patient Age:    82 years        BP:           158/53 mmHg Patient Gender: F               HR:           63 bpm. Exam Location:  Inpatient Procedure: 2D Echo, Saline Contrast Bubble Study, Color Doppler and Cardiac            Doppler Indications:    Stroke  History:        Patient has prior history of Echocardiogram examinations, most                 recent 08/19/2022. Stroke, Signs/Symptoms:Syncope; Risk                 Factors:Hypertension and Diabetes.  Sonographer:    Marella Chimes Referring Phys: [id number] [place]  1. Left ventricular ejection fraction, by estimation, is 60 to 65%. Left ventricular ejection fraction by PLAX is 64 %. The left ventricle has normal function. The left ventricle  has no regional wall motion abnormalities. There is mild left ventricular hypertrophy. Left ventricular diastolic parameters are consistent with Grade I diastolic dysfunction (impaired relaxation).  2. Right ventricular systolic function is low normal. The right ventricular size is normal.  3. The mitral valve is abnormal. Trivial mitral valve regurgitation.  4. The aortic valve is tricuspid. Aortic valve regurgitation is not visualized.  5. Agitated saline contrast bubble study was negative, with no evidence of any interatrial shunt. Comparison(s): Changes  from prior study are noted. 08/19/2022: LVEF 65-70%. FINDINGS  Left Ventricle: Left ventricular ejection fraction, by estimation, is 60 to 65%. Left ventricular ejection fraction by PLAX is 64 %. The left ventricle has normal function. The left ventricle has no regional wall motion abnormalities. The left ventricular internal cavity size was normal in size. There is mild left ventricular hypertrophy. Left ventricular diastolic parameters are consistent with Grade I diastolic dysfunction (impaired relaxation). Indeterminate filling pressures. Right Ventricle: The right ventricular size is normal. No increase in right ventricular wall thickness. Right ventricular systolic function is low normal. Left Atrium: Left atrial size was normal in size. Right Atrium: Right atrial size was normal in size. Pericardium: There is no evidence of pericardial effusion. Mitral Valve: The mitral valve is abnormal. There is mild thickening of the anterior and posterior mitral valve leaflet(s). Trivial mitral valve regurgitation. Tricuspid Valve: The tricuspid valve is grossly normal. Tricuspid valve regurgitation is trivial. Aortic Valve: The aortic valve is tricuspid. Aortic valve regurgitation is not visualized. Aortic valve mean gradient measures 5.0 mmHg. Aortic valve peak gradient measures 8.8 mmHg. Aortic valve area, by VTI measures 1.99 cm. Pulmonic Valve: The pulmonic valve  was normal in structure. Pulmonic valve regurgitation is not visualized. Aorta: The aortic root and ascending aorta are structurally normal, with no evidence of dilitation. IAS/Shunts: No atrial level shunt detected by color flow Doppler. Agitated saline contrast was given intravenously to evaluate for intracardiac shunting. Agitated saline contrast bubble study was negative, with no evidence of any interatrial shunt.  LEFT VENTRICLE PLAX 2D LV EF:         Left            Diastology                ventricular     LV e' medial:    5.44 cm/s                ejection        LV E/e' medial:  9.7                fraction by     LV e' lateral:   4.79 cm/s                PLAX is 64      LV E/e' lateral: 11.0                %. LVIDd:         4.10 cm LVIDs:         2.70 cm LV PW:         1.20 cm LV IVS:        1.20 cm LVOT diam:     2.00 cm LV SV:         72 LV SV Index:   36 LVOT Area:     3.14 cm  LV Volumes (MOD) LV vol d, MOD    64.2 ml A4C: LV vol s, MOD    26.0 ml A4C: LV SV MOD A4C:   64.2 ml RIGHT VENTRICLE RV S prime:     10.30 cm/s TAPSE (M-mode): 2.3 cm LEFT ATRIUM             Index        RIGHT ATRIUM          Index LA Vol (A2C):   25.7 ml 12.94 ml/m  RA Area:     9.81 cm LA Vol (A4C):  27.0 ml 13.59 ml/m  RA Volume:   21.10 ml 10.62 ml/m LA Biplane Vol: 26.5 ml 13.34 ml/m  AORTIC VALVE AV Area (Vmax):    1.70 cm AV Area (Vmean):   1.57 cm AV Area (VTI):     1.99 cm AV Vmax:           148.00 cm/s AV Vmean:          101.000 cm/s AV VTI:            0.362 m AV Peak Grad:      8.8 mmHg AV Mean Grad:      5.0 mmHg LVOT Vmax:         80.30 cm/s LVOT Vmean:        50.600 cm/s LVOT VTI:          0.229 m LVOT/AV VTI ratio: 0.63  AORTA Ao Root diam: 2.60 cm Ao Asc diam:  2.60 cm MITRAL VALVE MV Area (PHT): 2.10 cm    SHUNTS MV Decel Time: 361 msec    Systemic VTI:  0.23 m MV E velocity: 52.70 cm/s  Systemic Diam: 2.00 cm MV A velocity: 86.50 cm/s MV E/A ratio:  0.61 Lyman Bishop MD Electronically signed by  Lyman Bishop MD Signature Date/Time: 01/30/2023/3:29:38 PM    Final    MR ANGIO HEAD WO CONTRAST  Result Date: 01/29/2023 CLINICAL DATA:  Follow-up examination for acute stroke. EXAM: MRA NECK WITHOUT CONTRAST MRA HEAD WITHOUT CONTRAST TECHNIQUE: Angiographic images of the Circle of Willis were acquired using MRA technique without intravenous contrast. COMPARISON:  Prior brain MRI from earlier the same day. FINDINGS: MRA NECK FINDINGS Aortic arch: Visualized aortic arch normal in caliber with standard branch pattern. No stenosis or other abnormality about the origin of the great vessels. Right carotid system: Right common and internal carotid arteries are patent without evidence for dissection. No significant atheromatous irregularity or narrowing about the right carotid bulb for patient age. Left carotid system: Left common and internal carotid arteries are patent without evidence for dissection. No significant atheromatous irregularity or narrowing about the left carotid bulb for patient age. Vertebral arteries: Both vertebral arteries arise from subclavian arteries. No visible proximal subclavian artery stenosis. Vertebral arteries are largely codominant and widely patent without stenosis or dissection. Other: None. MRA HEAD FINDINGS Anterior circulation: Examination degraded by motion artifact. Right ICA patent through the siphon without flow-limiting stenosis. Atheromatous change within the left carotid siphon with associated severe stenosis at the proximal cavernous segment (series 252, image 13). Additional mild to moderate multifocal narrowing distally within the left ICA siphon. 5 mm saccular outpouching extending posteriorly from the supraclinoid left ICA consistent with the left PCOM aneurysm (series 2, image 72). A1 segments patent bilaterally. Grossly normal anterior communicating complex. Anterior cerebral arteries patent without visible stenosis. M1 segments patent bilaterally. No proximal MCA  branch occlusion or high-grade stenosis. Distal MCA branches perfused and symmetric. Posterior circulation: Visualized V4 segments patent without visible stenosis. Neither PICA origin visualized on this exam. Basilar mildly irregular without high-grade stenosis. Superior cerebral arteries patent bilaterally. Left PCA supplied via the basilar. Fetal type origin of the right PCA. Both PCAs patent to their distal aspects without stenosis. Anatomic variants: Fetal type origin of the right PCA. Other: None. IMPRESSION: MRA HEAD IMPRESSION: 1. Negative intracranial MRA for large vessel occlusion. 2. Atheromatous change about the left carotid siphon with up to severe stenosis at the proximal cavernous segment. 3. 5 mm left PCOM aneurysm. MRA NECK IMPRESSION:  Negative MRA of the neck. No large vessel occlusion or hemodynamically significant stenosis. Electronically Signed   By: Jeannine Boga M.D.   On: 01/29/2023 23:43   MR ANGIO NECK WO CONTRAST  Result Date: 01/29/2023 CLINICAL DATA:  Follow-up examination for acute stroke. EXAM: MRA NECK WITHOUT CONTRAST MRA HEAD WITHOUT CONTRAST TECHNIQUE: Angiographic images of the Circle of Willis were acquired using MRA technique without intravenous contrast. COMPARISON:  Prior brain MRI from earlier the same day. FINDINGS: MRA NECK FINDINGS Aortic arch: Visualized aortic arch normal in caliber with standard branch pattern. No stenosis or other abnormality about the origin of the great vessels. Right carotid system: Right common and internal carotid arteries are patent without evidence for dissection. No significant atheromatous irregularity or narrowing about the right carotid bulb for patient age. Left carotid system: Left common and internal carotid arteries are patent without evidence for dissection. No significant atheromatous irregularity or narrowing about the left carotid bulb for patient age. Vertebral arteries: Both vertebral arteries arise from subclavian  arteries. No visible proximal subclavian artery stenosis. Vertebral arteries are largely codominant and widely patent without stenosis or dissection. Other: None. MRA HEAD FINDINGS Anterior circulation: Examination degraded by motion artifact. Right ICA patent through the siphon without flow-limiting stenosis. Atheromatous change within the left carotid siphon with associated severe stenosis at the proximal cavernous segment (series 252, image 13). Additional mild to moderate multifocal narrowing distally within the left ICA siphon. 5 mm saccular outpouching extending posteriorly from the supraclinoid left ICA consistent with the left PCOM aneurysm (series 2, image 72). A1 segments patent bilaterally. Grossly normal anterior communicating complex. Anterior cerebral arteries patent without visible stenosis. M1 segments patent bilaterally. No proximal MCA branch occlusion or high-grade stenosis. Distal MCA branches perfused and symmetric. Posterior circulation: Visualized V4 segments patent without visible stenosis. Neither PICA origin visualized on this exam. Basilar mildly irregular without high-grade stenosis. Superior cerebral arteries patent bilaterally. Left PCA supplied via the basilar. Fetal type origin of the right PCA. Both PCAs patent to their distal aspects without stenosis. Anatomic variants: Fetal type origin of the right PCA. Other: None. IMPRESSION: MRA HEAD IMPRESSION: 1. Negative intracranial MRA for large vessel occlusion. 2. Atheromatous change about the left carotid siphon with up to severe stenosis at the proximal cavernous segment. 3. 5 mm left PCOM aneurysm. MRA NECK IMPRESSION: Negative MRA of the neck. No large vessel occlusion or hemodynamically significant stenosis. Electronically Signed   By: Jeannine Boga M.D.   On: 01/29/2023 23:43   MR BRAIN WO CONTRAST  Result Date: 01/29/2023 CLINICAL DATA:  Initial evaluation for neuro deficit, stroke suspected. EXAM: MRI HEAD WITHOUT  CONTRAST TECHNIQUE: Multiplanar, multiecho pulse sequences of the brain and surrounding structures were obtained without intravenous contrast. COMPARISON:  CT from earlier the same day. FINDINGS: Brain: Cerebral volume within normal limits for age. Patchy T2/FLAIR hyperintensity involving the periventricular deep white matter both cerebral hemispheres, consistent with chronic small vessel ischemic disease. Encephalomalacia and gliosis with chronic hemosiderin staining at the left occipital lobe, consistent with a chronic hemorrhagic infarct. 2.5 cm linear focus of restricted diffusion involving the paramedian right pons, consistent with an acute ischemic infarct (series 2, image 16). No associated hemorrhage or mass effect. No other evidence for acute or subacute ischemia. Gray-white matter differentiation otherwise maintained. No other acute or chronic intracranial blood products. No mass lesion, midline shift or mass effect. No hydrocephalus or extra-axial fluid collection. Pituitary gland and suprasellar region within normal limits. Vascular: Major intracranial vascular  flow voids are grossly maintained at the skull base. Skull and upper cervical spine: Craniocervical junction within normal limits. Diffuse loss of normal bone marrow signal, nonspecific but can be seen with anemia, smoking, obesity, and infiltrative/myelofibrotic marrow processes. No visible focal marrow replacing lesion. No scalp soft tissue abnormality. Sinuses/Orbits: Prior bilateral ocular lens replacement. Paranasal sinuses are largely clear. No significant mastoid effusion. Other: None. IMPRESSION: 1. 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 2. Chronic hemorrhagic left occipital infarct. 3. Underlying age-related cerebral atrophy with chronic microvascular ischemic disease. Electronically Signed   By: Jeannine Boga M.D.   On: 01/29/2023 19:38   CT HEAD WO CONTRAST  Result Date: 01/29/2023 CLINICAL DATA:   Syncope/presyncope, cerebrovascular cause suspected. Dizziness and slurred speech. EXAM: CT HEAD WITHOUT CONTRAST TECHNIQUE: Contiguous axial images were obtained from the base of the skull through the vertex without intravenous contrast. RADIATION DOSE REDUCTION: This exam was performed according to the departmental dose-optimization program which includes automated exposure control, adjustment of the mA and/or kV according to patient size and/or use of iterative reconstruction technique. COMPARISON:  Head CT 08/18/2022.  MRI brain 08/19/2022. FINDINGS: Brain: No acute hemorrhage. Unchanged chronic small-vessel disease and old infarct in the left parietal lobe. Cortical gray-white differentiation is otherwise preserved. Prominence of the ventricles and sulci within normal limits for age. No extra-axial collection. Basilar cisterns are patent. Vascular: No hyperdense vessel or unexpected calcification. Skull: No calvarial fracture or suspicious bone lesion. Skull base is unremarkable. Sinuses/Orbits: Unremarkable. Other: None. IMPRESSION: 1. No acute intracranial abnormality. 2. Unchanged chronic small-vessel disease and old infarct in the left parietal lobe. Electronically Signed   By: Emmit Alexanders M.D.   On: 01/29/2023 14:16   (Echo, Carotid, EGD, Colonoscopy, ERCP)    Subjective: Patient seen in the morning rounds.  Granddaughter at the bedside.  Also updated her daughter on the phone.  Pleasant and interactive.  Slow to respond.  Family thinks she is back to herself.   Discharge Exam: Vitals:   01/30/23 0700 01/30/23 1100  BP: (!) 158/53 (!) 150/53  Pulse: 60 (!) 56  Resp: 18 18  Temp: 98.2 F (36.8 C) 98.4 F (36.9 C)  SpO2: 100% 100%   Vitals:   01/30/23 0030 01/30/23 0134 01/30/23 0700 01/30/23 1100  BP: (!) 119/53 (!) 149/63 (!) 158/53 (!) 150/53  Pulse: 63 73 60 (!) 56  Resp: 15 17 18 18   Temp:  98.4 F (36.9 C) 98.2 F (36.8 C) 98.4 F (36.9 C)  TempSrc:  Oral Oral Oral   SpO2: 97% 94% 100% 100%  Weight:      Height:        General: Pt is alert, awake, not in acute distress Flat affect.  No obvious neurological deficit.  Slow speech but clear. Cardiovascular: RRR, S1/S2 +, no rubs, no gallops Respiratory: CTA bilaterally, no wheezing, no rhonchi Abdominal: Soft, NT, ND, bowel sounds + Extremities: no edema, no cyanosis    The results of significant diagnostics from this hospitalization (including imaging, microbiology, ancillary and laboratory) are listed below for reference.     Microbiology: No results found for this or any previous visit (from the past 240 hour(s)).   Labs: BNP (last 3 results) Recent Labs    08/18/22 2246  BNP [id number]   Basic Metabolic Panel: Recent Labs  Lab 01/29/23 1350 01/30/23 0447  NA 138 137  K 4.4 3.8  CL 104 107  CO2 23 22  GLUCOSE 122* 85  BUN 38* 34*  CREATININE 2.21* 1.96*  CALCIUM 10.2 9.0  MG  --  2.1  PHOS  --  3.6   Liver Function Tests: Recent Labs  Lab 01/29/23 1350 01/30/23 0447  AST 21 17  ALT 15 15  ALKPHOS 72 73  BILITOT 0.6 0.5  PROT 8.0 6.6  ALBUMIN 4.4 3.3*   No results for input(s): "LIPASE", "AMYLASE" in the last 168 hours. No results for input(s): "AMMONIA" in the last 168 hours. CBC: Recent Labs  Lab 01/29/23 1350 01/30/23 0447  WBC 8.8 7.8  NEUTROABS 5.3  --   HGB 11.2* 10.4*  HCT 34.2* 31.0*  MCV 89.8 89.9  PLT 294 267   Cardiac Enzymes: No results for input(s): "CKTOTAL", "CKMB", "CKMBINDEX", "TROPONINI" in the last 168 hours. BNP: Invalid input(s): "POCBNP" CBG: Recent Labs  Lab 01/29/23 1356 01/30/23 1145  GLUCAP 124* 101*   D-Dimer No results for input(s): "DDIMER" in the last 72 hours. Hgb A1c No results for input(s): "HGBA1C" in the last 72 hours. Lipid Profile Recent Labs    01/30/23 0447  CHOL 232*  HDL 51  LDLCALC 162*  TRIG 96  CHOLHDL 4.5   Thyroid function studies No results for input(s): "TSH", "T4TOTAL", "T3FREE", "THYROIDAB"  in the last 72 hours.  Invalid input(s): "FREET3" Anemia work up No results for input(s): "VITAMINB12", "FOLATE", "FERRITIN", "TIBC", "IRON", "RETICCTPCT" in the last 72 hours. Urinalysis    Component Value Date/Time   COLORURINE YELLOW (A) 08/18/2022 0115   APPEARANCEUR HAZY (A) 08/18/2022 0115   LABSPEC 1.016 08/18/2022 0115   PHURINE 5.0 08/18/2022 0115   GLUCOSEU NEGATIVE 08/18/2022 0115   HGBUR NEGATIVE 08/18/2022 0115   BILIRUBINUR NEGATIVE 08/18/2022 0115   KETONESUR NEGATIVE 08/18/2022 0115   PROTEINUR 100 (A) 08/18/2022 0115   NITRITE NEGATIVE 08/18/2022 0115   LEUKOCYTESUR TRACE (A) 08/18/2022 0115   Sepsis Labs Recent Labs  Lab 01/29/23 1350 01/30/23 0447  WBC 8.8 7.8   Microbiology No results found for this or any previous visit (from the past 240 hour(s)).   Time coordinating discharge:  35 minutes  SIGNED:   Barb Merino, MD  [facility] 01/30/2023, 3:51 PM

## 2023-01-30 NOTE — ED Notes (Signed)
ED TO INPATIENT HANDOFF REPORT  ED Nurse Name and Phone #: Rip Harbour RN [phone]  S Name/Age/Gender Bronson Curb 82 y.o. female Room/Bed: 020C/020C  Code Status   Code Status: Full Code  Home/SNF/Other Home Patient oriented to: self, place, time, and situation Is this baseline? Yes   Triage Complete: Triage complete  Chief Complaint CVA (cerebral vascular accident) [facility]) [I63.9]  Triage Note Reports episode of dizziness and slurred speech initially started afternoon; it improved, and then again Tuesday, Wednesday and then again this morning and has improved. Able to follow commands, and speech is clear. Denies unilateral deficits.    Allergies Allergies  Allergen Reactions   Atorvastatin Other (See Comments)    Mental status change   Ezetimibe Other (See Comments)    Muscle Pain   Lovastatin Other (See Comments)    Muscle Pain   Amlodipine Cough        Quinapril Cough    Level of Care/Admitting Diagnosis ED Disposition     ED Disposition  Admit   Condition  --   [place]: [place] [[id number]]  Level of Care: Telemetry Medical [104]  May admit patient to [facility] or [facility] if equivalent level of care is available:: No  Covid Evaluation: Asymptomatic - no recent exposure (last 10 days) testing not required  Diagnosis: CVA (cerebral vascular accident) [facility]) [id number]  Admitting Physician: Kayleen Memos [id number]  Attending Physician: Kayleen Memos [id number]  Certification:: I certify this patient will need inpatient services for at least 2 midnights  Estimated Length of Stay: 2          B Medical/Surgery History Past Medical History:  Diagnosis Date   Allergy    Asthma    Diabetes mellitus without complication ([place])    Gout    Hypertension    Past Surgical History:  Procedure Laterality Date   ABDOMINAL HYSTERECTOMY     BACK SURGERY     COLONOSCOPY     ROTATOR CUFF REPAIR Right      A IV  Location/Drains/Wounds Patient Lines/Drains/Airways Status     Active Line/Drains/Airways     Name Placement date Placement time Site Days   Peripheral IV 01/29/23 20 G 1" Left Forearm 01/29/23  2322  Forearm  1            Intake/Output Last 24 hours No intake or output data in the 24 hours ending 01/30/23 0235  Labs/Imaging Results for orders placed or performed during the hospital encounter of 01/29/23 (from the past 48 hour(s))  Protime-INR     Status: None   Collection Time: 01/29/23  1:50 PM  Result Value Ref Range   Prothrombin Time 13.8 11.4 - 15.2 seconds   INR 1.1 0.8 - 1.2    Comment: (NOTE) INR goal varies based on device and disease states. Performed at [facility], [address] [place], [place] [address]   APTT     Status: None   Collection Time: 01/29/23  1:50 PM  Result Value Ref Range   aPTT 30 24 - 36 seconds    Comment: Performed at [facility], [address] [place], [place] [address]  CBC     Status: Abnormal   Collection Time: 01/29/23  1:50 PM  Result Value Ref Range   WBC 8.8 4.0 - 10.5 K/uL   RBC 3.81 (L) 3.87 - 5.11 MIL/uL   Hemoglobin 11.2 (L) 12.0 - 15.0 g/dL  HCT 34.2 (L) 36.0 - 46.0 %   MCV 89.8 80.0 - 100.0 fL   MCH 29.4 26.0 - 34.0 pg   MCHC 32.7 30.0 - 36.0 g/dL   RDW 14.1 11.5 - 15.5 %   Platelets 294 150 - 400 K/uL   nRBC 0.0 0.0 - 0.2 %    Comment: Performed at [facility], [address] [place], [place] [address]  Differential     Status: Abnormal   Collection Time: 01/29/23  1:50 PM  Result Value Ref Range   Neutrophils Relative % 59 %   Neutro Abs 5.3 1.7 - 7.7 K/uL   Lymphocytes Relative 22 %   Lymphs Abs 1.9 0.7 - 4.0 K/uL   Monocytes Relative 6 %   Monocytes Absolute 0.5 0.1 - 1.0 K/uL   Eosinophils Relative 11 %   Eosinophils Absolute 1.0 (H) 0.0 - 0.5 K/uL   Basophils Relative 1 %   Basophils Absolute 0.1 0.0 - 0.1 K/uL   Immature Granulocytes 1 %   Abs Immature  Granulocytes 0.06 0.00 - 0.07 K/uL    Comment: Performed at [facility], [address] [place], [place] [address]  Comprehensive metabolic panel     Status: Abnormal   Collection Time: 01/29/23  1:50 PM  Result Value Ref Range   Sodium 138 135 - 145 mmol/L   Potassium 4.4 3.5 - 5.1 mmol/L   Chloride 104 98 - 111 mmol/L   CO2 23 22 - 32 mmol/L   Glucose, Bld 122 (H) 70 - 99 mg/dL    Comment: Glucose reference range applies only to samples taken after fasting for at least 8 hours.   BUN 38 (H) 8 - 23 mg/dL   Creatinine, Ser 2.21 (H) 0.44 - 1.00 mg/dL   Calcium 10.2 8.9 - 10.3 mg/dL   Total Protein 8.0 6.5 - 8.1 g/dL   Albumin 4.4 3.5 - 5.0 g/dL   AST 21 15 - 41 U/L   ALT 15 0 - 44 U/L   Alkaline Phosphatase 72 38 - 126 U/L   Total Bilirubin 0.6 0.3 - 1.2 mg/dL   GFR, Estimated 22 (L) >60 mL/min    Comment: (NOTE) Calculated using the CKD-EPI Creatinine Equation (2021)    Anion gap 11 5 - 15    Comment: Performed at [facility], [address] [place], [place] [address]  Ethanol     Status: None   Collection Time: 01/29/23  1:50 PM  Result Value Ref Range   Alcohol, Ethyl (B) <10 <10 mg/dL    Comment: (NOTE) Lowest detectable limit for serum alcohol is 10 mg/dL.  For medical purposes only. Performed at [facility], [address] [place], [place] [address]   CBG monitoring, ED     Status: Abnormal   Collection Time: 01/29/23  1:56 PM  Result Value Ref Range   Glucose-Capillary 124 (H) 70 - 99 mg/dL    Comment: Glucose reference range applies only to samples taken after fasting for at least 8 hours.   MR ANGIO HEAD WO CONTRAST  Result Date: 01/29/2023 CLINICAL DATA:  Follow-up examination for acute stroke. EXAM: MRA NECK WITHOUT CONTRAST MRA HEAD WITHOUT CONTRAST TECHNIQUE: Angiographic images of the Circle of Willis were acquired using MRA technique without intravenous contrast. COMPARISON:   Prior brain MRI from earlier the same day. FINDINGS: MRA NECK FINDINGS Aortic arch: Visualized aortic arch normal in caliber with  standard branch pattern. No stenosis or other abnormality about the origin of the great vessels. Right carotid system: Right common and internal carotid arteries are patent without evidence for dissection. No significant atheromatous irregularity or narrowing about the right carotid bulb for patient age. Left carotid system: Left common and internal carotid arteries are patent without evidence for dissection. No significant atheromatous irregularity or narrowing about the left carotid bulb for patient age. Vertebral arteries: Both vertebral arteries arise from subclavian arteries. No visible proximal subclavian artery stenosis. Vertebral arteries are largely codominant and widely patent without stenosis or dissection. Other: None. MRA HEAD FINDINGS Anterior circulation: Examination degraded by motion artifact. Right ICA patent through the siphon without flow-limiting stenosis. Atheromatous change within the left carotid siphon with associated severe stenosis at the proximal cavernous segment (series 252, image 13). Additional mild to moderate multifocal narrowing distally within the left ICA siphon. 5 mm saccular outpouching extending posteriorly from the supraclinoid left ICA consistent with the left PCOM aneurysm (series 2, image 72). A1 segments patent bilaterally. Grossly normal anterior communicating complex. Anterior cerebral arteries patent without visible stenosis. M1 segments patent bilaterally. No proximal MCA branch occlusion or high-grade stenosis. Distal MCA branches perfused and symmetric. Posterior circulation: Visualized V4 segments patent without visible stenosis. Neither PICA origin visualized on this exam. Basilar mildly irregular without high-grade stenosis. Superior cerebral arteries patent bilaterally. Left PCA supplied via the basilar. Fetal type origin of the right  PCA. Both PCAs patent to their distal aspects without stenosis. Anatomic variants: Fetal type origin of the right PCA. Other: None. IMPRESSION: MRA HEAD IMPRESSION: 1. Negative intracranial MRA for large vessel occlusion. 2. Atheromatous change about the left carotid siphon with up to severe stenosis at the proximal cavernous segment. 3. 5 mm left PCOM aneurysm. MRA NECK IMPRESSION: Negative MRA of the neck. No large vessel occlusion or hemodynamically significant stenosis. Electronically Signed   By: Jeannine Boga M.D.   On: 01/29/2023 23:43   MR ANGIO NECK WO CONTRAST  Result Date: 01/29/2023 CLINICAL DATA:  Follow-up examination for acute stroke. EXAM: MRA NECK WITHOUT CONTRAST MRA HEAD WITHOUT CONTRAST TECHNIQUE: Angiographic images of the Circle of Willis were acquired using MRA technique without intravenous contrast. COMPARISON:  Prior brain MRI from earlier the same day. FINDINGS: MRA NECK FINDINGS Aortic arch: Visualized aortic arch normal in caliber with standard branch pattern. No stenosis or other abnormality about the origin of the great vessels. Right carotid system: Right common and internal carotid arteries are patent without evidence for dissection. No significant atheromatous irregularity or narrowing about the right carotid bulb for patient age. Left carotid system: Left common and internal carotid arteries are patent without evidence for dissection. No significant atheromatous irregularity or narrowing about the left carotid bulb for patient age. Vertebral arteries: Both vertebral arteries arise from subclavian arteries. No visible proximal subclavian artery stenosis. Vertebral arteries are largely codominant and widely patent without stenosis or dissection. Other: None. MRA HEAD FINDINGS Anterior circulation: Examination degraded by motion artifact. Right ICA patent through the siphon without flow-limiting stenosis. Atheromatous change within the left carotid siphon with associated  severe stenosis at the proximal cavernous segment (series 252, image 13). Additional mild to moderate multifocal narrowing distally within the left ICA siphon. 5 mm saccular outpouching extending posteriorly from the supraclinoid left ICA consistent with the left PCOM aneurysm (series 2, image 72). A1 segments patent bilaterally. Grossly normal anterior communicating complex. Anterior cerebral arteries patent without visible stenosis. M1 segments patent bilaterally. No proximal MCA  branch occlusion or high-grade stenosis. Distal MCA branches perfused and symmetric. Posterior circulation: Visualized V4 segments patent without visible stenosis. Neither PICA origin visualized on this exam. Basilar mildly irregular without high-grade stenosis. Superior cerebral arteries patent bilaterally. Left PCA supplied via the basilar. Fetal type origin of the right PCA. Both PCAs patent to their distal aspects without stenosis. Anatomic variants: Fetal type origin of the right PCA. Other: None. IMPRESSION: MRA HEAD IMPRESSION: 1. Negative intracranial MRA for large vessel occlusion. 2. Atheromatous change about the left carotid siphon with up to severe stenosis at the proximal cavernous segment. 3. 5 mm left PCOM aneurysm. MRA NECK IMPRESSION: Negative MRA of the neck. No large vessel occlusion or hemodynamically significant stenosis. Electronically Signed   By: Jeannine Boga M.D.   On: 01/29/2023 23:43   MR BRAIN WO CONTRAST  Result Date: 01/29/2023 CLINICAL DATA:  Initial evaluation for neuro deficit, stroke suspected. EXAM: MRI HEAD WITHOUT CONTRAST TECHNIQUE: Multiplanar, multiecho pulse sequences of the brain and surrounding structures were obtained without intravenous contrast. COMPARISON:  CT from earlier the same day. FINDINGS: Brain: Cerebral volume within normal limits for age. Patchy T2/FLAIR hyperintensity involving the periventricular deep white matter both cerebral hemispheres, consistent with chronic  small vessel ischemic disease. Encephalomalacia and gliosis with chronic hemosiderin staining at the left occipital lobe, consistent with a chronic hemorrhagic infarct. 2.5 cm linear focus of restricted diffusion involving the paramedian right pons, consistent with an acute ischemic infarct (series 2, image 16). No associated hemorrhage or mass effect. No other evidence for acute or subacute ischemia. Gray-white matter differentiation otherwise maintained. No other acute or chronic intracranial blood products. No mass lesion, midline shift or mass effect. No hydrocephalus or extra-axial fluid collection. Pituitary gland and suprasellar region within normal limits. Vascular: Major intracranial vascular flow voids are grossly maintained at the skull base. Skull and upper cervical spine: Craniocervical junction within normal limits. Diffuse loss of normal bone marrow signal, nonspecific but can be seen with anemia, smoking, obesity, and infiltrative/myelofibrotic marrow processes. No visible focal marrow replacing lesion. No scalp soft tissue abnormality. Sinuses/Orbits: Prior bilateral ocular lens replacement. Paranasal sinuses are largely clear. No significant mastoid effusion. Other: None. IMPRESSION: 1. 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 2. Chronic hemorrhagic left occipital infarct. 3. Underlying age-related cerebral atrophy with chronic microvascular ischemic disease. Electronically Signed   By: Jeannine Boga M.D.   On: 01/29/2023 19:38   CT HEAD WO CONTRAST  Result Date: 01/29/2023 CLINICAL DATA:  Syncope/presyncope, cerebrovascular cause suspected. Dizziness and slurred speech. EXAM: CT HEAD WITHOUT CONTRAST TECHNIQUE: Contiguous axial images were obtained from the base of the skull through the vertex without intravenous contrast. RADIATION DOSE REDUCTION: This exam was performed according to the departmental dose-optimization program which includes automated exposure  control, adjustment of the mA and/or kV according to patient size and/or use of iterative reconstruction technique. COMPARISON:  Head CT 08/18/2022.  MRI brain 08/19/2022. FINDINGS: Brain: No acute hemorrhage. Unchanged chronic small-vessel disease and old infarct in the left parietal lobe. Cortical gray-white differentiation is otherwise preserved. Prominence of the ventricles and sulci within normal limits for age. No extra-axial collection. Basilar cisterns are patent. Vascular: No hyperdense vessel or unexpected calcification. Skull: No calvarial fracture or suspicious bone lesion. Skull base is unremarkable. Sinuses/Orbits: Unremarkable. Other: None. IMPRESSION: 1. No acute intracranial abnormality. 2. Unchanged chronic small-vessel disease and old infarct in the left parietal lobe. Electronically Signed   By: Emmit Alexanders M.D.   On: 01/29/2023 14:16  Pending Labs Unresulted Labs (From admission, onward)     Start     Ordered   01/30/23 0500  Lipid panel  Tomorrow morning,   R        01/29/23 2144   01/30/23 0500  Hemoglobin A1c  Tomorrow morning,   R        01/29/23 2144   01/30/23 0500  CBC  Tomorrow morning,   R        01/29/23 2145   01/30/23 0500  Comprehensive metabolic panel  Tomorrow morning,   R        01/29/23 2145   01/30/23 0500  Magnesium  Tomorrow morning,   R        01/29/23 2145   01/30/23 0500  Phosphorus  Tomorrow morning,   R        01/29/23 2145            Vitals/Pain Today's Vitals   01/29/23 2130 01/29/23 2330 01/30/23 0030 01/30/23 0134  BP: (!) 161/67 (!) 157/62 (!) 119/53 (!) 149/63  Pulse: 62 (!) 59 63 73  Resp: 20 20 15 17   Temp:    98.4 F (36.9 C)  TempSrc:    Oral  SpO2: 98% 100% 97% 94%  Weight:      Height:      PainSc:        Isolation Precautions No active isolations  Medications Medications  atorvastatin (LIPITOR) tablet 40 mg (has no administration in time range)  levothyroxine (SYNTHROID) tablet 50 mcg (has no administration  in time range)  aspirin EC tablet 81 mg (has no administration in time range)  clopidogrel (PLAVIX) tablet 75 mg (has no administration in time range)  lactated ringers infusion ( Intravenous New Bag/Given 01/29/23 2324)  acetaminophen (TYLENOL) tablet 650 mg (has no administration in time range)  polyethylene glycol (MIRALAX / GLYCOLAX) packet 17 g (has no administration in time range)  melatonin tablet 5 mg (has no administration in time range)  prochlorperazine (COMPAZINE) injection 5 mg (has no administration in time range)  sodium chloride flush (NS) 0.9 % injection 3 mL (3 mLs Intravenous Given 01/29/23 1445)  aspirin chewable tablet 81 mg (81 mg Oral Given 01/29/23 2141)  clopidogrel (PLAVIX) tablet 300 mg (300 mg Oral Given 01/29/23 2141)    Mobility walks     Focused Assessments Cardiac Assessment Handoff:  Cardiac Rhythm: Normal sinus rhythm No results found for: "CKTOTAL", "CKMB", "CKMBINDEX", "TROPONINI" No results found for: "DDIMER" Does the Patient currently have chest pain? No   , Neuro Assessment Handoff:  Swallow screen pass? Yes  Cardiac Rhythm: Normal sinus rhythm NIH Stroke Scale  Dizziness Present: No Headache Present: No Interval: Initial Level of Consciousness (1a.)   : Alert, keenly responsive LOC Questions (1b. )   : Answers both questions correctly LOC Commands (1c. )   : Performs both tasks correctly Best Gaze (2. )  : Normal Visual (3. )  : No visual loss Facial Palsy (4. )    : Normal symmetrical movements Motor Arm, Left (5a. )   : No drift Motor Arm, Right (5b. ) : No drift Motor Leg, Left (6a. )  : No drift Motor Leg, Right (6b. ) : No drift Limb Ataxia (7. ): Absent Sensory (8. )  : Normal, no sensory loss Best Language (9. )  : No aphasia Dysarthria (10. ): Normal Extinction/Inattention (11.)   : No Abnormality Complete NIHSS TOTAL: 0     Neuro Assessment:  Neuro Checks:   Initial (01/29/23 1358)  Has TPA been given? No If patient  is a Neuro Trauma and patient is going to OR before floor call report to [place] nurse: [phone] or [phone]   R Recommendations: See Admitting Provider Note  Report given to:   Additional Notes: Pt is A&Ox4. Pt is continent and ambulatory.

## 2023-01-30 NOTE — Care Management Obs Status (Signed)
[facility] NOTIFICATION   Patient Details  Name: Maria Cunningham MRN: [id number] Date of Birth: [birth year]   Medicare Observation Status Notification Given:  Yes    Marilu Favre, RN 01/30/2023, 4:40 PM

## 2023-01-30 NOTE — Evaluation (Signed)
Physical Therapy Evaluation Patient Details Name: Maria Cunningham MRN: [id number] DOB: 19-Aug-1941 Today's Date: 01/30/2023  History of Present Illness  Pt is an 82 y.o. female who presented 01/29/23 with intermittent dizziness x2 weeks and slurred speech. MRI showed acute ischemic nonhemorrhagic right pontine infarct. PMH: DM2, dementia, CKD 4, hypothyroidism, obesity, asthma, gout, HTN   Clinical Impression  Pt presents with condition above and deficits mentioned below, see PT Problem List. PTA, she was mod I for functional mobility intermittently holding onto furniture for support vs using her [name]. She needed assistance for cognitive tasks due to her dementia. She stayed with her daughter and granddaughter x5-6 nights/week and then would stay in her own home alone x1-2 nights/week. Granddaughter reports the plan would be for the pt to stay with them 24/7. The daughter works remotely. They live in a 1-level house with x1 small STE. Currently, pt displays deficits in balance, L lower extremity coordination, power, and activity tolerance. She was able to perform all functional mobility at a min guard assist level. She demonstrated improved gait stability and speed when using a SPC vs when using a RW or no AD, thus recommend pt utilize her [facility] for all standing mobility at d/c as she is at risk for falls. Recommending follow-up with HHPT/HHOT upon d/c home with family support. Will continue to follow acutely.     Recommendations for follow up therapy are one component of a multi-disciplinary discharge planning process, led by the attending physician.  Recommendations may be updated based on patient status, additional functional criteria and insurance authorization.  Follow Up Recommendations Home health PT      Assistance Recommended at Discharge Frequent or constant Supervision/Assistance  Patient can return home with the following  A little help with bathing/dressing/bathroom;Assistance with  cooking/housework;Direct supervision/assist for medications management;Direct supervision/assist for financial management;Assist for transportation;Help with stairs or ramp for entrance    Equipment Recommendations Other (comment) (tub bench/shower chair)  Recommendations for Other Services       Functional Status Assessment Patient has had a recent decline in their functional status and demonstrates the ability to make significant improvements in function in a reasonable and predictable amount of time.     Precautions / Restrictions Precautions Precautions: Fall Restrictions Weight Bearing Restrictions: No      Mobility  Bed Mobility Overal bed mobility: Needs Assistance Bed Mobility: Supine to Sit     Supine to sit: Supervision, HOB elevated     General bed mobility comments: Extra time and use of bed rail but able to sit up with supervision for safety    Transfers Overall transfer level: Needs assistance Equipment used: None, Straight cane Transfers: Sit to/from Stand Sit to Stand: Min guard           General transfer comment: Extra time to power up to stand 1x from EOB to no AD and 1x from recliner with SPC in R hand, no LOB, min guard for safety    Ambulation/Gait Ambulation/Gait assistance: Min guard Gait Distance (Feet): 160 Feet (x2 bouts of ~160 ft > ~100 ft) Assistive device: Rolling walker (2 wheels), None, Straight cane Gait Pattern/deviations: Step-through pattern, Decreased stride length, Wide base of support Gait velocity: reduced Gait velocity interpretation: <1.8 ft/sec, indicate of risk for recurrent falls   General Gait Details: Pt initially ambulating without UE support, displaying short bil steps and mildly widened BOS. Mild drift L and R but no LOB. When provided RW, she appeared to slow her gait further. On second  bout, provided her with SPC in R hand and she appeared to display a more steady gait with no drifting and increased gait speed.  Needs cues to actually use the [name] and not just carry it though. No LOB, min guard for safety  Stairs            Wheelchair Mobility    Modified Rankin (Stroke Patients Only) Modified Rankin (Stroke Patients Only) Pre-Morbid Rankin Score: Moderate disability Modified Rankin: Moderate disability     Balance Overall balance assessment: Mild deficits observed, not formally tested (benefits from [name])                                           Pertinent Vitals/Pain Pain Assessment Pain Assessment: No/denies pain    Home Living Family/patient expects to be discharged to:: Private residence Living Arrangements: Alone (stays with daughter and granddaughter 90% of time, sometimes likes to go to her home alone 1-2 days/week and other family within walking distance of her home) Available Help at Discharge: Family;Available 24 hours/day Type of Home: House Home Access: Stairs to enter Entrance Stairs-Rails: None Entrance Stairs-Number of Steps: 1 (small)   Home Layout: One level Home Equipment: Cane - single point;Grab bars - tub/shower Additional Comments: info above for daughter and granddaughter's home as plan is d/c home with them for 24/7 care (daughter works remotely)    Prior Function Prior Level of Function : Needs assist  Cognitive Assist : ADLs (cognitive)           Mobility Comments: Mod I; uses SPC intermittently, furniture surfs majority of time; no recent falls (~1 year) ADLs Comments: help for pill box set-up, does not drive; can warm up food but does not cook; does clean; dresses and bathes herself but granddaughter reports recent concerns in regards to pt's hygiene     Hand Dominance   Dominant Hand: Right    Extremity/Trunk Assessment   Upper Extremity Assessment Upper Extremity Assessment: Defer to OT evaluation    Lower Extremity Assessment Lower Extremity Assessment: LLE deficits/detail LLE Deficits / Details: noted  incoordination on L with simultaneous feet tapping; otherwise no numbness/tingling bil and MMT scores of 4 hip flexion bil, 5 knee extension bil, and 5 ankle dorsiflexion bil LLE Sensation: WNL LLE Coordination: decreased gross motor    Cervical / Trunk Assessment Cervical / Trunk Assessment: Normal  Communication   Communication: Expressive difficulties (slurred)  Cognition Arousal/Alertness: Awake/alert Behavior During Therapy: WFL for tasks assessed/performed Overall Cognitive Status: History of cognitive impairments - at baseline                                 General Comments: Hx of cognitive deficits with diagnosis of dementia. Reports date is "March 2008", but otherwice oriented to self (extra time to recall year of birth) and location. Disoriented to situation, stating she is here due to "sickness" and "health issues". Granddaughter reports pt is closer to her normal currently, often disoriented to time and situation at baseline.        General Comments General comments (skin integrity, edema, etc.): educated pt and granddaughter on "BE FAST" and recs to use pt's SPC upon d/c; pt denying dizziness this session    Exercises     Assessment/Plan    PT Assessment Patient needs continued PT services  PT Problem List Decreased activity tolerance;Decreased balance;Decreased mobility;Decreased cognition;Decreased coordination       PT Treatment Interventions DME instruction;Gait training;Stair training;Functional mobility training;Therapeutic activities;Therapeutic exercise;Balance training;Neuromuscular re-education;Cognitive remediation;Patient/family education    PT Goals (Current goals can be found in the Care Plan section)  Acute Rehab PT Goals Patient Stated Goal: to improve PT Goal Formulation: With patient/family Time For Goal Achievement: 02/13/23 Potential to Achieve Goals: Good    Frequency Min 3X/week     Co-evaluation                AM-PAC PT "6 Clicks" Mobility  Outcome Measure Help needed turning from your back to your side while in a flat bed without using bedrails?: None Help needed moving from lying on your back to sitting on the side of a flat bed without using bedrails?: A Little Help needed moving to and from a bed to a chair (including a wheelchair)?: A Little Help needed standing up from a chair using your arms (e.g., wheelchair or bedside chair)?: A Little Help needed to walk in hospital room?: A Little Help needed climbing 3-5 steps with a railing? : A Little 6 Click Score: 19    End of Session Equipment Utilized During Treatment: Gait belt Activity Tolerance: Patient tolerated treatment well Patient left: in chair;with call bell/phone within reach;with chair alarm set;with family/visitor present Nurse Communication: Mobility status PT Visit Diagnosis: Unsteadiness on feet (R26.81);Other abnormalities of gait and mobility (R26.89);Other symptoms and signs involving the nervous system (R29.898)    Time: [id number] PT Time Calculation (min) (ACUTE ONLY): 34 min   Charges:   PT Evaluation $PT Eval Moderate Complexity: 1 Mod PT Treatments $Gait Training: 8-22 mins        Moishe Spice, PT, DPT Acute Rehabilitation Services  Office: [phone]   Orvan Falconer 01/30/2023, 12:48 PM

## 2023-01-30 NOTE — Progress Notes (Signed)
PROGRESS NOTE    Maria Cunningham  [id number] DOB: 06-27-1941 DOA: 01/29/2023 PCP: Gladstone Lighter, MD    Brief Narrative:  82 year old female with history of dementia, type 2 diabetes, CKD stage IV, hypothyroidism who lives at home with her daughter presented to the emergency room with intermittent dizziness for past 2 weeks, slurring of speech.  Nonspecific symptoms.  CT scan of the head was essentially normal.  MRI revealed 2.5 cm acute ischemic nonhemorrhagic right pontine stroke.  Admitted with neurology consultation.  Hemodynamically stable in the ER.   Assessment & Plan:   Acute ischemic stroke: Clinical findings, dizziness and lightheadedness, slurring of speech.  Already improving. CT head findings, no acute findings. 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  Chronic hemorrhagic left occipital infarct. MR angiogram head and neck, no large vessel occlusion.  Atherosclerotic changes, severe stenosis proximal cavernous segment.  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  Neck MRA without any significant obstruction. 2D echocardiogram with bubble study, pending today. Antiplatelet therapy, none at home.  Started on aspirin and Plavix.  Plavix loaded. LDL 162, started on atorvastatin 40 mg daily.  Hemoglobin A1c, pending. DVT prophylaxis, heparin. Therapy recommendations, home health PT OT. Echocardiogram pending.  Neurology final recommendations pending.  Anticipate home with home health therapies and outpatient neurology follow-up.  Acute kidney injury on chronic kidney disease stage IV: Treated with IV fluids.  Back to her normal self.  Encourage oral intake.  Chronic medical issues  Essential hypertension, blood pressure stable. Hypothyroidism, on levothyroxine, continue. Dementia, fairly stable.  Lives at home and has adequate support system at home.     DVT prophylaxis: SCDs Start: 01/29/23 2144   Code Status: Full code Family  Communication: Granddaughter at the bedside Disposition Plan: Status is: Inpatient Remains inpatient appropriate because: Inpatient workup     Consultants:  Neurology  Procedures:  None  Antimicrobials:  None   Subjective: Patient seen and examined in the morning rounds.  Her granddaughter was at the bedside.  Stated almost back to herself.  Patient herself denies any complaints.  Pleasant and interactive.  Objective: Vitals:   01/30/23 0030 01/30/23 0134 01/30/23 0700 01/30/23 1100  BP: (!) 119/53 (!) 149/63 (!) 158/53 (!) 150/53  Pulse: 63 73 60 (!) 56  Resp: 15 17 18 18   Temp:  98.4 F (36.9 C) 98.2 F (36.8 C) 98.4 F (36.9 C)  TempSrc:  Oral Oral Oral  SpO2: 97% 94% 100% 100%  Weight:      Height:        Intake/Output Summary (Last 24 hours) at 01/30/2023 1155 Last data filed at 01/30/2023 0900 Gross per 24 hour  Intake 320 ml  Output 600 ml  Net -280 ml   Filed Weights   01/29/23 1347  Weight: 91.6 kg    Examination:  General exam: Appears calm and comfortable, pleasant and interactive. Respiratory system: No added sounds. Cardiovascular system: S1 & S2 heard, RRR. No pedal edema. Gastrointestinal system: Soft and nontender. Central nervous system: Alert and oriented. No demonstrable neurological deficits. Extremities: Symmetric 5 x 5 power.  Generalized weakness.     Data Reviewed: I have personally reviewed following labs and imaging studies  CBC: Recent Labs  Lab 01/29/23 1350 01/30/23 0447  WBC 8.8 7.8  NEUTROABS 5.3  --   HGB 11.2* 10.4*  HCT 34.2* 31.0*  MCV 89.8 89.9  PLT 294 [id number]   Basic Metabolic Panel: Recent Labs  Lab 01/29/23 1350 01/30/23 0447  NA 138 137  K 4.4 3.8  CL 104 107  CO2 23 22  GLUCOSE 122* 85  BUN 38* 34*  CREATININE 2.21* 1.96*  CALCIUM 10.2 9.0  MG  --  2.1  PHOS  --  3.6   GFR: Estimated Creatinine Clearance: 24.7 mL/min (A) (by C-G formula based on SCr of 1.96 mg/dL (H)). Liver Function  Tests: Recent Labs  Lab 01/29/23 1350 01/30/23 0447  AST 21 17  ALT 15 15  ALKPHOS 72 73  BILITOT 0.6 0.5  PROT 8.0 6.6  ALBUMIN 4.4 3.3*   No results for input(s): "LIPASE", "AMYLASE" in the last 168 hours. No results for input(s): "AMMONIA" in the last 168 hours. Coagulation Profile: Recent Labs  Lab 01/29/23 1350  INR 1.1   Cardiac Enzymes: No results for input(s): "CKTOTAL", "CKMB", "CKMBINDEX", "TROPONINI" in the last 168 hours. BNP (last 3 results) No results for input(s): "PROBNP" in the last 8760 hours. HbA1C: No results for input(s): "HGBA1C" in the last 72 hours. CBG: Recent Labs  Lab 01/29/23 1356 01/30/23 1145  GLUCAP 124* 101*   Lipid Profile: Recent Labs    01/30/23 0447  CHOL 232*  HDL 51  LDLCALC 162*  TRIG 96  CHOLHDL 4.5   Thyroid Function Tests: No results for input(s): "TSH", "T4TOTAL", "FREET4", "T3FREE", "THYROIDAB" in the last 72 hours. Anemia Panel: No results for input(s): "VITAMINB12", "FOLATE", "FERRITIN", "TIBC", "IRON", "RETICCTPCT" in the last 72 hours. Sepsis Labs: No results for input(s): "PROCALCITON", "LATICACIDVEN" in the last 168 hours.  No results found for this or any previous visit (from the past 240 hour(s)).       Radiology Studies: MR ANGIO HEAD WO CONTRAST  Result Date: 01/29/2023 CLINICAL DATA:  Follow-up examination for acute stroke. EXAM: MRA NECK WITHOUT CONTRAST MRA HEAD WITHOUT CONTRAST TECHNIQUE: Angiographic images of the Circle of Willis were acquired using MRA technique without intravenous contrast. COMPARISON:  Prior brain MRI from earlier the same day. FINDINGS: MRA NECK FINDINGS Aortic arch: Visualized aortic arch normal in caliber with standard branch pattern. No stenosis or other abnormality about the origin of the great vessels. Right carotid system: Right common and internal carotid arteries are patent without evidence for dissection. No significant atheromatous irregularity or narrowing about the  right carotid bulb for patient age. Left carotid system: Left common and internal carotid arteries are patent without evidence for dissection. No significant atheromatous irregularity or narrowing about the left carotid bulb for patient age. Vertebral arteries: Both vertebral arteries arise from subclavian arteries. No visible proximal subclavian artery stenosis. Vertebral arteries are largely codominant and widely patent without stenosis or dissection. Other: None. MRA HEAD FINDINGS Anterior circulation: Examination degraded by motion artifact. Right ICA patent through the siphon without flow-limiting stenosis. Atheromatous change within the left carotid siphon with associated severe stenosis at the proximal cavernous segment (series 252, image 13). Additional mild to moderate multifocal narrowing distally within the left ICA siphon. 5 mm saccular outpouching extending posteriorly from the supraclinoid left ICA consistent with the left PCOM aneurysm (series 2, image 72). A1 segments patent bilaterally. Grossly normal anterior communicating complex. Anterior cerebral arteries patent without visible stenosis. M1 segments patent bilaterally. No proximal MCA branch occlusion or high-grade stenosis. Distal MCA branches perfused and symmetric. Posterior circulation: Visualized V4 segments patent without visible stenosis. Neither PICA origin visualized on this exam. Basilar mildly irregular without high-grade stenosis. Superior cerebral arteries patent bilaterally. Left PCA supplied via the basilar. Fetal type origin of the right PCA. Both  PCAs patent to their distal aspects without stenosis. Anatomic variants: Fetal type origin of the right PCA. Other: None. IMPRESSION: MRA HEAD IMPRESSION: 1. Negative intracranial MRA for large vessel occlusion. 2. Atheromatous change about the left carotid siphon with up to severe stenosis at the proximal cavernous segment. 3. 5 mm left PCOM aneurysm. MRA NECK IMPRESSION: Negative MRA  of the neck. No large vessel occlusion or hemodynamically significant stenosis. Electronically Signed   By: Jeannine Boga M.D.   On: 01/29/2023 23:43   MR ANGIO NECK WO CONTRAST  Result Date: 01/29/2023 CLINICAL DATA:  Follow-up examination for acute stroke. EXAM: MRA NECK WITHOUT CONTRAST MRA HEAD WITHOUT CONTRAST TECHNIQUE: Angiographic images of the Circle of Willis were acquired using MRA technique without intravenous contrast. COMPARISON:  Prior brain MRI from earlier the same day. FINDINGS: MRA NECK FINDINGS Aortic arch: Visualized aortic arch normal in caliber with standard branch pattern. No stenosis or other abnormality about the origin of the great vessels. Right carotid system: Right common and internal carotid arteries are patent without evidence for dissection. No significant atheromatous irregularity or narrowing about the right carotid bulb for patient age. Left carotid system: Left common and internal carotid arteries are patent without evidence for dissection. No significant atheromatous irregularity or narrowing about the left carotid bulb for patient age. Vertebral arteries: Both vertebral arteries arise from subclavian arteries. No visible proximal subclavian artery stenosis. Vertebral arteries are largely codominant and widely patent without stenosis or dissection. Other: None. MRA HEAD FINDINGS Anterior circulation: Examination degraded by motion artifact. Right ICA patent through the siphon without flow-limiting stenosis. Atheromatous change within the left carotid siphon with associated severe stenosis at the proximal cavernous segment (series 252, image 13). Additional mild to moderate multifocal narrowing distally within the left ICA siphon. 5 mm saccular outpouching extending posteriorly from the supraclinoid left ICA consistent with the left PCOM aneurysm (series 2, image 72). A1 segments patent bilaterally. Grossly normal anterior communicating complex. Anterior cerebral  arteries patent without visible stenosis. M1 segments patent bilaterally. No proximal MCA branch occlusion or high-grade stenosis. Distal MCA branches perfused and symmetric. Posterior circulation: Visualized V4 segments patent without visible stenosis. Neither PICA origin visualized on this exam. Basilar mildly irregular without high-grade stenosis. Superior cerebral arteries patent bilaterally. Left PCA supplied via the basilar. Fetal type origin of the right PCA. Both PCAs patent to their distal aspects without stenosis. Anatomic variants: Fetal type origin of the right PCA. Other: None. IMPRESSION: MRA HEAD IMPRESSION: 1. Negative intracranial MRA for large vessel occlusion. 2. Atheromatous change about the left carotid siphon with up to severe stenosis at the proximal cavernous segment. 3. 5 mm left PCOM aneurysm. MRA NECK IMPRESSION: Negative MRA of the neck. No large vessel occlusion or hemodynamically significant stenosis. Electronically Signed   By: Jeannine Boga M.D.   On: 01/29/2023 23:43   MR BRAIN WO CONTRAST  Result Date: 01/29/2023 CLINICAL DATA:  Initial evaluation for neuro deficit, stroke suspected. EXAM: MRI HEAD WITHOUT CONTRAST TECHNIQUE: Multiplanar, multiecho pulse sequences of the brain and surrounding structures were obtained without intravenous contrast. COMPARISON:  CT from earlier the same day. FINDINGS: Brain: Cerebral volume within normal limits for age. Patchy T2/FLAIR hyperintensity involving the periventricular deep white matter both cerebral hemispheres, consistent with chronic small vessel ischemic disease. Encephalomalacia and gliosis with chronic hemosiderin staining at the left occipital lobe, consistent with a chronic hemorrhagic infarct. 2.5 cm linear focus of restricted diffusion involving the paramedian right pons, consistent with an acute ischemic  infarct (series 2, image 16). No associated hemorrhage or mass effect. No other evidence for acute or subacute  ischemia. Gray-white matter differentiation otherwise maintained. No other acute or chronic intracranial blood products. No mass lesion, midline shift or mass effect. No hydrocephalus or extra-axial fluid collection. Pituitary gland and suprasellar region within normal limits. Vascular: Major intracranial vascular flow voids are grossly maintained at the skull base. Skull and upper cervical spine: Craniocervical junction within normal limits. Diffuse loss of normal bone marrow signal, nonspecific but can be seen with anemia, smoking, obesity, and infiltrative/myelofibrotic marrow processes. No visible focal marrow replacing lesion. No scalp soft tissue abnormality. Sinuses/Orbits: Prior bilateral ocular lens replacement. Paranasal sinuses are largely clear. No significant mastoid effusion. Other: None. IMPRESSION: 1. 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 2. Chronic hemorrhagic left occipital infarct. 3. Underlying age-related cerebral atrophy with chronic microvascular ischemic disease. Electronically Signed   By: Jeannine Boga M.D.   On: 01/29/2023 19:38   CT HEAD WO CONTRAST  Result Date: 01/29/2023 CLINICAL DATA:  Syncope/presyncope, cerebrovascular cause suspected. Dizziness and slurred speech. EXAM: CT HEAD WITHOUT CONTRAST TECHNIQUE: Contiguous axial images were obtained from the base of the skull through the vertex without intravenous contrast. RADIATION DOSE REDUCTION: This exam was performed according to the departmental dose-optimization program which includes automated exposure control, adjustment of the mA and/or kV according to patient size and/or use of iterative reconstruction technique. COMPARISON:  Head CT 08/18/2022.  MRI brain 08/19/2022. FINDINGS: Brain: No acute hemorrhage. Unchanged chronic small-vessel disease and old infarct in the left parietal lobe. Cortical gray-white differentiation is otherwise preserved. Prominence of the ventricles and sulci within  normal limits for age. No extra-axial collection. Basilar cisterns are patent. Vascular: No hyperdense vessel or unexpected calcification. Skull: No calvarial fracture or suspicious bone lesion. Skull base is unremarkable. Sinuses/Orbits: Unremarkable. Other: None. IMPRESSION: 1. No acute intracranial abnormality. 2. Unchanged chronic small-vessel disease and old infarct in the left parietal lobe. Electronically Signed   By: Emmit Alexanders M.D.   On: 01/29/2023 14:16        Scheduled Meds:  aspirin EC  81 mg Oral Daily   atorvastatin  40 mg Oral Daily   clopidogrel  75 mg Oral Daily   levothyroxine  50 mcg Oral Q0600   Continuous Infusions:  lactated ringers 50 mL/hr at 01/29/23 2324     LOS: 1 day    Time spent: 35 minutes    Barb Merino, MD [facility] Pager [phone]

## 2023-01-30 NOTE — Plan of Care (Signed)
  Problem: Education: Goal: Knowledge of General Education information will improve Description: Including pain rating scale, medication(s)/side effects and non-pharmacologic comfort measures Outcome: Progressing   Problem: Health Behavior/Discharge Planning: Goal: Ability to manage health-related needs will improve Outcome: Progressing   Problem: Clinical Measurements: Goal: Ability to maintain clinical measurements within normal limits will improve Outcome: Progressing Goal: Will remain free from infection Outcome: Progressing Goal: Diagnostic test results will improve Outcome: Progressing Goal: Respiratory complications will improve Outcome: Progressing Goal: Cardiovascular complication will be avoided Outcome: Progressing   Problem: Education: Goal: Knowledge of disease or condition will improve Outcome: Progressing Goal: Knowledge of secondary prevention will improve (MUST DOCUMENT ALL) Outcome: Progressing Goal: Knowledge of patient specific risk factors will improve [place] N/A or DELETE if not current risk factor) Outcome: Progressing   Problem: Ischemic Stroke/TIA Tissue Perfusion: Goal: Complications of ischemic stroke/TIA will be minimized Outcome: Progressing   Problem: Safety: Goal: Ability to remain free from injury will improve Outcome: Progressing   Problem: Skin Integrity: Goal: Risk for impaired skin integrity will decrease Outcome: Progressing   Problem: Pain Managment: Goal: General experience of comfort will improve Outcome: Progressing   Problem: Health Behavior/Discharge Planning: Goal: Ability to manage health-related needs will improve Outcome: Progressing Goal: Goals will be collaboratively established with patient/family Outcome: Progressing   Problem: Self-Care: Goal: Ability to participate in self-care as condition permits will improve Outcome: Progressing Goal: Verbalization of feelings and concerns over difficulty with self-care will  improve Outcome: Progressing Goal: Ability to communicate needs accurately will improve Outcome: Progressing   Problem: Nutrition: Goal: Risk of aspiration will decrease Outcome: Progressing Goal: Dietary intake will improve Outcome: Progressing   Problem: Education: Goal: Knowledge of disease or condition will improve Outcome: Progressing Goal: Knowledge of secondary prevention will improve (MUST DOCUMENT ALL) Outcome: Progressing Goal: Knowledge of patient specific risk factors will improve [place] N/A or DELETE if not current risk factor) Outcome: Progressing   Problem: Intracerebral Hemorrhage Tissue Perfusion: Goal: Complications of Intracerebral Hemorrhage will be minimized Outcome: Progressing   Problem: Coping: Goal: Will verbalize positive feelings about self Outcome: Progressing Goal: Will identify appropriate support needs Outcome: Progressing   Problem: Self-Care: Goal: Ability to participate in self-care as condition permits will improve Outcome: Progressing Goal: Verbalization of feelings and concerns over difficulty with self-care will improve Outcome: Progressing Goal: Ability to communicate needs accurately will improve Outcome: Progressing

## 2023-01-30 NOTE — TOC Initial Note (Signed)
Transition of Care (TOC) - Initial/Assessment Note   Spoke to patient and grand daughter Auriel at bedside.   Confirmed face sheet information.   PT secure chatted team recommendation for OT and shower chair ot tub bench.   Auriel has no preference for home health agency. Kelly with [facility] accepted referral for HHPT/OT will need orders and face to face .   NCM called Erasmo Downer with [facility] and placed on speaker phone. Kristin discussed shower chair and tub bench and tub transfer bench with Auriel. Auriel decided on tub transfer bench. Per [employer] will not cover tub transfer bench. If family purchase same with [employer] cost is $60.00, however Erasmo Downer explained [facility] has tub transfer bench for a lesser cost. Aur[facility] .  Patient Details  Name: Maria Cunningham MRN: [id number] Date of Birth: 03-07-1941  Transition of Care [facility]) CM/SW Contact:    Marilu Favre, RN Phone Number: 01/30/2023, 10:56 AM  Clinical Narrative:                   Expected Discharge Plan: [place] Barriers to Discharge: Continued Medical Work up   Patient Goals and CMS Choice Patient states their goals for this hospitalization and ongoing recovery are:: grand daughter to return home CMS Medicare.gov Compare Post Acute Care list provided to:: Patient Represenative (must comment) (grand daughter Marilynn Rail) Choice offered to / list presented to : Adult [place] ownership interest in [facility].provided to:: Adult Children    Expected Discharge Plan and Services   Discharge Planning Services: CM Consult Post Acute Care Choice: Home Health, Durable Medical Equipment Living arrangements for the past 2 months: Single Family Home                 DME Arranged:  (see note)         HH Arranged: PT, OT HH Agency: [facility] Date [place]: 01/30/23 Time HH Agency  Contacted: 34 Representative spoke with at [place]: Claiborne Billings  Prior Living Arrangements/Services Living arrangements for the past 2 months: [place] with:: Adult Children Patient language and need for interpreter reviewed:: Yes Do you feel safe going back to the place where you live?: Yes      Need for Family Participation in Patient Care: Yes (Comment) Care giver support system in place?: Yes (comment) Current home services: DME Criminal Activity/Legal Involvement Pertinent to Current Situation/Hospitalization: No - Comment as needed  Activities of Daily Living Home Assistive Devices/Equipment: None ADL Screening (condition at time of admission) Patient's cognitive ability adequate to safely complete daily activities?: Yes Is the patient deaf or have difficulty hearing?: No Does the patient have difficulty seeing, even when wearing glasses/contacts?: No Does the patient have difficulty concentrating, remembering, or making decisions?: Yes Patient able to express need for assistance with ADLs?: Yes Does the patient have difficulty dressing or bathing?: Yes Independently performs ADLs?: No Does the patient have difficulty walking or climbing stairs?: Yes Weakness of Legs: Both Weakness of Arms/Hands: None  Permission Sought/Granted   Permission granted to share information with : Yes, Verbal Permission Granted  Share Information with NAME: Marilynn Rail grand daughter           Emotional Assessment Appearance:: Appears stated age     Orientation: : Oriented to Self Alcohol / Substance Use: Not Applicable Psych Involvement: No (comment)  Admission diagnosis:  Dizziness [R42] Slurred speech [  R47.81] CVA (cerebral vascular accident) [facility]) [I63.9] Patient Active Problem List   Diagnosis Date Noted   CVA (cerebral vascular accident) ([place]) 01/29/2023   Acute kidney injury superimposed on CKD ([place]) 08/20/2022   Syncope 08/19/2022   Asthma    Hypertension     Gout    Type II diabetes mellitus with renal manifestations ([place])    Hypothyroidism    PCP:  Gladstone Lighter, MD Pharmacy:   [facility] [address], [place] - [place] [address] [place] [address] Phone: [phone] Fax: [phone]  [facility] [place], [place] [address] [place] [address] Phone: [phone] Fax: [phone]  [place], [place] - [address] [place] [address] Phone: [phone] Fax: [phone]  [place] [address], [place] [address] Phone: [phone] Fax: [phone]  [facility] [place] [place] [address] [place] [address] Phone: [phone] Fax: [phone]     Social Determinants of Health (SDOH) Social History: [place]: No Food Insecurity (01/30/2023)  Housing: Low Risk  (01/30/2023)  Transportation Needs: No Transportation Needs (01/30/2023)  Utilities: Not At Risk (01/30/2023)  Tobacco Use: Low Risk  (01/29/2023)   SDOH Interventions: Housing Interventions: Intervention Not Indicated   Readmission Risk Interventions     No data to display

## 2023-01-30 NOTE — Consult Note (Addendum)
Neurology Consultation Reason for Consult: Stroke Referring Physician: [place] Crane, C  CC: Slurred speech  History is obtained from: Daughter  HPI: Maria Cunningham is a 82 y.o. female with a history of slurred speech that they first noticed on Sunday.  It got worse yesterday and has been persistent and therefore they sought care in the emergency department.  She denies numbness, weakness, but she has had some unsteadiness per her daughter.  The patient has a history of dementia and is an unreliable historian.  She denies double vision currently.   LKW: Sunday tnk given?: no, outside of window  Past Medical History:  Diagnosis Date   Allergy    Asthma    Diabetes mellitus without complication ([place])    Gout    Hypertension      Family History  Problem Relation Age of Onset   Hypertension Sister      Social History:  reports that she has never smoked. She has never used smokeless tobacco. She reports that she does not drink alcohol and does not use drugs.   Exam: Current vital signs: BP (!) 149/63 (BP Location: Right Arm)   Pulse 73   Temp 98.4 F (36.9 C) (Oral)   Resp 17   Ht 5\' 5"  (1.651 m)   Wt 91.6 kg   SpO2 94%   BMI 33.61 kg/m  Vital signs in last 24 hours: Temp:  [97.5 F (36.4 C)-98.4 F (36.9 C)] 98.4 F (36.9 C) (03/22 0134) Pulse Rate:  [59-73] 73 (03/22 0134) Resp:  [15-20] 17 (03/22 0134) BP: (119-187)/(53-68) 149/63 (03/22 0134) SpO2:  [94 %-100 %] 94 % (03/22 0134) Weight:  [91.6 kg] 91.6 kg (03/21 1347)   Physical Exam  Appears well-developed and well-nourished.   Neuro: Mental Status: Patient is awake, alert, gives month is September, year is 1923  No signs of aphasia or neglect She is dysarthric Cranial Nerves: II: Visual Fields are full. Pupils are equal, round, and reactive to light.   III,IV, VI: EOMI without ptosis or diploplia.  V: Facial sensation is symmetric to temperature VII: Facial movement with left facial weakness VIII:  hearing is intact to voice X: Uvula elevates symmetrically XI: Shoulder shrug is symmetric. XII: tongue is midline without atrophy or fasciculations.  Motor: Tone is normal. Bulk is normal. 5/5 strength was present in bilateral upper extremities, she gives poor effort in bilateral lower extremities, but I do question mild left leg weakness Sensory: Sensation is symmetric to light touch and temperature in the arms and legs. Cerebellar: FNF with mild tremor on the right, but no past-pointing.      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  She will need to be admitted for physical therapy as well as secondary risk factor modification.  Recommendations: - HgbA1c, fasting lipid panel - Frequent neuro checks - Echocardiogram - Prophylactic therapy-Antiplatelet med: Aspirin - dose 81mg  and plavix 75mg  daily  after 300mg  load  - Risk factor modification - Telemetry monitoring - PT consult, OT consult, Speech consult - Stroke team to follow    Roland Rack, MD [facility] [phone]  If 7pm- 7am, please page neurology on call as listed in [place].

## 2023-01-30 NOTE — Care Management CC44 (Signed)
Condition Code 44 Documentation Completed  Patient Details  Name: Summers Miura MRN: [id number] Date of Birth: 02-18-1941   Condition Code 44 given:  Yes Patient signature on Condition Code 44 notice:  Yes Documentation of 2 MD's agreement:  Yes Code 44 added to claim:  Yes    Marilu Favre, RN 01/30/2023, 4:40 PM

## 2023-01-30 NOTE — Evaluation (Addendum)
Occupational Therapy Evaluation Patient Details Name: Maria Cunningham MRN: [id number] DOB: July 06, 1941 Today's Date: 01/30/2023   History of Present Illness Pt is an 82 y.o. female who presented 01/29/23 with intermittent dizziness x2 weeks and slurred speech. MRI showed acute ischemic nonhemorrhagic right pontine infarct. PMH: DM2, dementia, CKD 4, hypothyroidism, obesity, asthma, gout, HTN   Clinical Impression   PTA patient independent with ADLs, some assist for iADLs and using cane vs no AD/furniture walking for mobility. Admitted for above and presents with problem list below.  Today, she completes ADLs with up to min assist, transfers with min guard and functional mobility holding IV pole with min guard.  Min cueing for safety and problem solving during session. Cognitively, hx of dementia with pt being oriented to self and place only but following simple commands throughout session.  Deficits seen in recall, problem solving, and safety. Discussed safety and recommendations for ADLs, IADLs.  Granddaughter supportive and report likely plan for 24/7 support at dc.  Will follow acutely to optimize independence and safety, but anticipate no further need required after dc home.      Recommendations for follow up therapy are one component of a multi-disciplinary discharge planning process, led by the attending physician.  Recommendations may be updated based on patient status, additional functional criteria and insurance authorization.   Follow Up Recommendations  No OT follow up     Assistance Recommended at Discharge Frequent or constant Supervision/Assistance  Patient can return home with the following A little help with walking and/or transfers;A little help with bathing/dressing/bathroom;Assistance with cooking/housework;Direct supervision/assist for medications management;Direct supervision/assist for financial management;Assist for transportation;Help with stairs or ramp for entrance     Functional Status Assessment  Patient has had a recent decline in their functional status and demonstrates the ability to make significant improvements in function in a reasonable and predictable amount of time.  Equipment Recommendations  Tub/shower bench    Recommendations for Other Services       Precautions / Restrictions Precautions Precautions: Fall Restrictions Weight Bearing Restrictions: No      Mobility Bed Mobility Overal bed mobility: Needs Assistance Bed Mobility: Supine to Sit, Sit to Supine     Supine to sit: Supervision Sit to supine: Supervision   General bed mobility comments: no assist required    Transfers Overall transfer level: Needs assistance Equipment used: None Transfers: Sit to/from Stand Sit to Stand: Min guard           General transfer comment: for safety, once upright using IV pole for support with R UE      Balance Overall balance assessment: Mild deficits observed, not formally tested                                         ADL either performed or assessed with clinical judgement   ADL Overall ADL's : Needs assistance/impaired     Grooming: Min guard;Wash/dry hands;Sitting           Upper Body Dressing : Set up;Sitting   Lower Body Dressing: Minimal assistance;Sit to/from stand Lower Body Dressing Details (indicate cue type and reason): assist for socks, min guard standing Toilet Transfer: Min guard;Ambulation   Toileting- Clothing Manipulation and Hygiene: Min guard;Sit to/from stand;Sitting/lateral lean       Functional mobility during ADLs: Min guard;Cueing for safety (IV pole)       Vision   Additional  Comments: pt reports needing her glasses, granddaughter reprots "they haven't been able to find them"     Perception     Praxis      Pertinent Vitals/Pain Pain Assessment Pain Assessment: No/denies pain     Hand Dominance Right   Extremity/Trunk Assessment Upper Extremity  Assessment Upper Extremity Assessment: LUE deficits/detail;Generalized weakness LUE Deficits / Details: slightly weaker than R UE but grossly 3+/5 MMT, mild decreased coordination but overall WFL   Lower Extremity Assessment Lower Extremity Assessment: Defer to PT evaluation       Communication Communication Communication: Expressive difficulties (slurred)   Cognition Arousal/Alertness: Awake/alert Behavior During Therapy: WFL for tasks assessed/performed Overall Cognitive Status: History of cognitive impairments - at baseline                                 General Comments: pt with hx of dementia.  Family reports close to baseline with progressive decline in memory more recently.  Oriented to self and place, able to follow simple commands but decreased awarenss noted.     General Comments  educated pt and grandaughter on recommendations, assist for ADLS as needed/ assist for IADLs and sitting in shower    Exercises     Shoulder Instructions      Home Living Family/patient expects to be discharged to:: Private residence Living Arrangements: Alone (stays with daughter and granddaughter 90% of time, sometimes likes to go to her home alone 1-2 days/week and other family within walking distance of her home) Available Help at Discharge: Family;Available 24 hours/day Type of Home: House Home Access: Stairs to enter [employer] of Steps: 1 Entrance Stairs-Rails: None Home Layout: One level     Bathroom Shower/Tub: Teacher, early years/pre: Standard     Home Equipment: Cane - single point;Grab bars - tub/shower   Additional Comments: info above for daughter and granddaughter's home as plan is d/c home with them for 24/7 care (daughter works remotely)  Lives With: Family    Prior Functioning/Environment Prior Level of Function : Needs assist  Cognitive Assist : ADLs (cognitive)           Mobility Comments: Mod I; uses SPC  intermittently, furniture surfs majority of time; no recent falls (~1 year) ADLs Comments: help for pill box set-up, does not drive; can warm up food but does not cook; does clean; dresses and bathes herself but granddaughter reports recent concerns in regards to pt's hygiene        OT Problem List: Decreased strength;Decreased activity tolerance;Impaired balance (sitting and/or standing);Obesity;Decreased knowledge of precautions;Decreased knowledge of use of DME or AE;Decreased safety awareness;Decreased cognition      OT Treatment/Interventions: Self-care/ADL training;DME and/or AE instruction;Neuromuscular education;Therapeutic activities;Cognitive remediation/compensation;Patient/family education;Balance training    OT Goals(Current goals can be found in the care plan section) Acute Rehab OT Goals Patient Stated Goal: none stated OT Goal Formulation: Patient unable to participate in goal setting Time For Goal Achievement: 02/13/23 Potential to Achieve Goals: Good  OT Frequency: Min 2X/week    Co-evaluation              AM-PAC OT "6 Clicks" Daily Activity     Outcome Measure Help from another person eating meals?: A Little Help from another person taking care of personal grooming?: A Little Help from another person toileting, which includes using toliet, bedpan, or urinal?: A Little Help from another person bathing (including washing, rinsing, drying)?:  A Little Help from another person to put on and taking off regular upper body clothing?: A Little Help from another person to put on and taking off regular lower body clothing?: A Little 6 Click Score: 18   End of Session Nurse Communication: Mobility status (+void urine)  Activity Tolerance: Patient tolerated treatment well Patient left: in bed;with call bell/phone within reach;with family/visitor present  OT Visit Diagnosis: Other abnormalities of gait and mobility (R26.89);Muscle weakness (generalized) (M62.81);Other  symptoms and signs involving the nervous system (R29.898)                Time: [id number] OT Time Calculation (min): 21 min Charges:  OT General Charges $OT Visit: 1 Visit OT Evaluation $OT Eval Moderate Complexity: 1 Mod  Jolaine Artist, OT Acute Rehabilitation Services Office [phone]   Delight Stare 01/30/2023, 1:35 PM

## 2023-01-30 NOTE — Progress Notes (Addendum)
Put STROKE TEAM PROGRESS NOTE   INTERVAL HISTORY Her friend daughter is at the bedside.  Patient is laying in the bed in no apparent distress he is slightly confused cannot answer all orientation questions appropriately however answers other questions wrong.  Granddaughter feels patient's speech has improved and there is no more slurring but does seem to be a little confused at times MRI brain with an acute right pontine ischemic infarct.  Echo is pending Neurological exam is unchanged Vitals:   01/30/23 0030 01/30/23 0134 01/30/23 0700 01/30/23 1100  BP: (!) 119/53 (!) 149/63 (!) 158/53 (!) 150/53  Pulse: 63 73 60 (!) 56  Resp: 15 17 18 18   Temp:  98.4 F (36.9 C) 98.2 F (36.8 C) 98.4 F (36.9 C)  TempSrc:  Oral Oral Oral  SpO2: 97% 94% 100% 100%  Weight:      Height:       CBC:  Recent Labs  Lab 01/29/23 1350 01/30/23 0447  WBC 8.8 7.8  NEUTROABS 5.3  --   HGB 11.2* 10.4*  HCT 34.2* 31.0*  MCV 89.8 89.9  PLT 294 [id number]   Basic Metabolic Panel:  Recent Labs  Lab 01/29/23 1350 01/30/23 0447  NA 138 137  K 4.4 3.8  CL 104 107  CO2 23 22  GLUCOSE 122* 85  BUN 38* 34*  CREATININE 2.21* 1.96*  CALCIUM 10.2 9.0  MG  --  2.1  PHOS  --  3.6   Lipid Panel:  Recent Labs  Lab 01/30/23 0447  CHOL 232*  TRIG 96  HDL 51  CHOLHDL 4.5  VLDL 19  LDLCALC 162*   HgbA1c: No results for input(s): "HGBA1C" in the last 168 hours. Urine Drug Screen: No results for input(s): "LABOPIA", "COCAINSCRNUR", "LABBENZ", "AMPHETMU", "THCU", "LABBARB" in the last 168 hours.  Alcohol Level  Recent Labs  Lab 01/29/23 1350  ETH <10    IMAGING past 24 hours MR ANGIO HEAD WO CONTRAST  Result Date: 01/29/2023 CLINICAL DATA:  Follow-up examination for acute stroke. EXAM: MRA NECK WITHOUT CONTRAST MRA HEAD WITHOUT CONTRAST TECHNIQUE: Angiographic images of the Circle of Willis were acquired using MRA technique without intravenous contrast. COMPARISON:  Prior brain MRI from earlier the same  day. FINDINGS: MRA NECK FINDINGS Aortic arch: Visualized aortic arch normal in caliber with standard branch pattern. No stenosis or other abnormality about the origin of the great vessels. Right carotid system: Right common and internal carotid arteries are patent without evidence for dissection. No significant atheromatous irregularity or narrowing about the right carotid bulb for patient age. Left carotid system: Left common and internal carotid arteries are patent without evidence for dissection. No significant atheromatous irregularity or narrowing about the left carotid bulb for patient age. Vertebral arteries: Both vertebral arteries arise from subclavian arteries. No visible proximal subclavian artery stenosis. Vertebral arteries are largely codominant and widely patent without stenosis or dissection. Other: None. MRA HEAD FINDINGS Anterior circulation: Examination degraded by motion artifact. Right ICA patent through the siphon without flow-limiting stenosis. Atheromatous change within the left carotid siphon with associated severe stenosis at the proximal cavernous segment (series 252, image 13). Additional mild to moderate multifocal narrowing distally within the left ICA siphon. 5 mm saccular outpouching extending posteriorly from the supraclinoid left ICA consistent with the left PCOM aneurysm (series 2, image 72). A1 segments patent bilaterally. Grossly normal anterior communicating complex. Anterior cerebral arteries patent without visible stenosis. M1 segments patent bilaterally. No proximal MCA branch occlusion or high-grade stenosis. Distal  MCA branches perfused and symmetric. Posterior circulation: Visualized V4 segments patent without visible stenosis. Neither PICA origin visualized on this exam. Basilar mildly irregular without high-grade stenosis. Superior cerebral arteries patent bilaterally. Left PCA supplied via the basilar. Fetal type origin of the right PCA. Both PCAs patent to their distal  aspects without stenosis. Anatomic variants: Fetal type origin of the right PCA. Other: None. IMPRESSION: MRA HEAD IMPRESSION: 1. Negative intracranial MRA for large vessel occlusion. 2. Atheromatous change about the left carotid siphon with up to severe stenosis at the proximal cavernous segment. 3. 5 mm left PCOM aneurysm. MRA NECK IMPRESSION: Negative MRA of the neck. No large vessel occlusion or hemodynamically significant stenosis. Electronically Signed   By: Jeannine Boga M.D.   On: 01/29/2023 23:43   MR ANGIO NECK WO CONTRAST  Result Date: 01/29/2023 CLINICAL DATA:  Follow-up examination for acute stroke. EXAM: MRA NECK WITHOUT CONTRAST MRA HEAD WITHOUT CONTRAST TECHNIQUE: Angiographic images of the Circle of Willis were acquired using MRA technique without intravenous contrast. COMPARISON:  Prior brain MRI from earlier the same day. FINDINGS: MRA NECK FINDINGS Aortic arch: Visualized aortic arch normal in caliber with standard branch pattern. No stenosis or other abnormality about the origin of the great vessels. Right carotid system: Right common and internal carotid arteries are patent without evidence for dissection. No significant atheromatous irregularity or narrowing about the right carotid bulb for patient age. Left carotid system: Left common and internal carotid arteries are patent without evidence for dissection. No significant atheromatous irregularity or narrowing about the left carotid bulb for patient age. Vertebral arteries: Both vertebral arteries arise from subclavian arteries. No visible proximal subclavian artery stenosis. Vertebral arteries are largely codominant and widely patent without stenosis or dissection. Other: None. MRA HEAD FINDINGS Anterior circulation: Examination degraded by motion artifact. Right ICA patent through the siphon without flow-limiting stenosis. Atheromatous change within the left carotid siphon with associated severe stenosis at the proximal cavernous  segment (series 252, image 13). Additional mild to moderate multifocal narrowing distally within the left ICA siphon. 5 mm saccular outpouching extending posteriorly from the supraclinoid left ICA consistent with the left PCOM aneurysm (series 2, image 72). A1 segments patent bilaterally. Grossly normal anterior communicating complex. Anterior cerebral arteries patent without visible stenosis. M1 segments patent bilaterally. No proximal MCA branch occlusion or high-grade stenosis. Distal MCA branches perfused and symmetric. Posterior circulation: Visualized V4 segments patent without visible stenosis. Neither PICA origin visualized on this exam. Basilar mildly irregular without high-grade stenosis. Superior cerebral arteries patent bilaterally. Left PCA supplied via the basilar. Fetal type origin of the right PCA. Both PCAs patent to their distal aspects without stenosis. Anatomic variants: Fetal type origin of the right PCA. Other: None. IMPRESSION: MRA HEAD IMPRESSION: 1. Negative intracranial MRA for large vessel occlusion. 2. Atheromatous change about the left carotid siphon with up to severe stenosis at the proximal cavernous segment. 3. 5 mm left PCOM aneurysm. MRA NECK IMPRESSION: Negative MRA of the neck. No large vessel occlusion or hemodynamically significant stenosis. Electronically Signed   By: Jeannine Boga M.D.   On: 01/29/2023 23:43   MR BRAIN WO CONTRAST  Result Date: 01/29/2023 CLINICAL DATA:  Initial evaluation for neuro deficit, stroke suspected. EXAM: MRI HEAD WITHOUT CONTRAST TECHNIQUE: Multiplanar, multiecho pulse sequences of the brain and surrounding structures were obtained without intravenous contrast. COMPARISON:  CT from earlier the same day. FINDINGS: Brain: Cerebral volume within normal limits for age. Patchy T2/FLAIR hyperintensity involving the periventricular deep white  matter both cerebral hemispheres, consistent with chronic small vessel ischemic disease.  Encephalomalacia and gliosis with chronic hemosiderin staining at the left occipital lobe, consistent with a chronic hemorrhagic infarct. 2.5 cm linear focus of restricted diffusion involving the paramedian right pons, consistent with an acute ischemic infarct (series 2, image 16). No associated hemorrhage or mass effect. No other evidence for acute or subacute ischemia. Gray-white matter differentiation otherwise maintained. No other acute or chronic intracranial blood products. No mass lesion, midline shift or mass effect. No hydrocephalus or extra-axial fluid collection. Pituitary gland and suprasellar region within normal limits. Vascular: Major intracranial vascular flow voids are grossly maintained at the skull base. Skull and upper cervical spine: Craniocervical junction within normal limits. Diffuse loss of normal bone marrow signal, nonspecific but can be seen with anemia, smoking, obesity, and infiltrative/myelofibrotic marrow processes. No visible focal marrow replacing lesion. No scalp soft tissue abnormality. Sinuses/Orbits: Prior bilateral ocular lens replacement. Paranasal sinuses are largely clear. No significant mastoid effusion. Other: None. IMPRESSION: 1. 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 2. Chronic hemorrhagic left occipital infarct. 3. Underlying age-related cerebral atrophy with chronic microvascular ischemic disease. Electronically Signed   By: Jeannine Boga M.D.   On: 01/29/2023 19:38    PHYSICAL EXAM  Temp:  [97.6 F (36.4 C)-98.4 F (36.9 C)] 98.4 F (36.9 C) (03/22 1100) Pulse Rate:  [56-73] 56 (03/22 1100) Resp:  [15-20] 18 (03/22 1100) BP: (119-161)/(53-68) 150/53 (03/22 1100) SpO2:  [94 %-100 %] 100 % (03/22 1100)  General - Well nourished, well developed, in no apparent distress. Cardiovascular - Regular rhythm and rate.  Mental Status -  Level of arousal and orientation to time, place, and person were intact.  She is confused about  some details leading up to her hospitalization.  No dysarthria Language including expression, naming, repetition, comprehension was assessed and found intact. Attention span and concentration were normal.  Cranial Nerves II - XII - II - Visual field intact OU. III, IV, VI - Extraocular movements intact. V - Facial sensation intact bilaterally. VII -slight left facial VIII - Hearing & vestibular intact bilaterally. X - Palate elevates symmetrically. XI - Chin turning & shoulder shrug intact bilaterally. XII - Tongue protrusion intact.  Motor Strength - The patient's strength was normal in all extremities and pronator drift was absent.  Bulk was normal and fasciculations were absent.   Motor Tone - Muscle tone was assessed at the neck and appendages and was normal.  Sensory - Light touch, temperature/pinprick were assessed and were symmetrical.    Coordination - The patient had normal movements in the hands and feet with no ataxia or dysmetria.  Tremor was absent.  Gait and Station - deferred.  ASSESSMENT/PLAN Ms. Carlis Gatzke is a 82 y.o. female with history of dementia, type 2 diabetes, CKD 4, hypothyroidism, obesity, who presented to [facility] ED due to intermittent dizziness for the past 2 weeks, worse yesterday and associated with slurring of her speech.   Stroke: Acute right pontine ischemic infarct Etiology:  small vessel disease   CT head No acute abnormality. Small vessel disease.  Old left parietal infarct MRI acute right pontine ischemic infarct MRA head/neck negative LVO.  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  2D Echo EF 60 to 65%, mild left ventricular hypertrophy, left ventricle grade 1 diastolic dysfunction, no shunt LDL 162 HgbA1c pending VTE prophylaxis -SCDs Aspirin 81 mg daily prior to admission, now on aspirin 81 mg daily and  Plavix 75 mg daily for 3 weeks and then Plavix alone. 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 Will need follow-up with  Dr.Deveshwar, who is aware, his office will call and make an appointment on Monday  hypertension Home meds: Imdur 30 mg daily Micardis 40-12.5 mg daily, felodipine 5mg   Stable Long-term BP goal normotensive  Hyperlipidemia Home meds:  none, resumed in hospital LDL 162, goal < 70 Add atorvastatin 40mg    Continue statin at discharge  Diabetes type II Controlled Home meds:  none HgbA1c pending, goal < 7.0 CBGs SSI  Other Stroke Risk Factors Advanced Age >/= 5  Obesity, Body mass index is 33.61 kg/m., BMI >/= 30 associated with increased stroke risk, recommend weight loss, diet and exercise as appropriate  Hx stroke/TIA, old left occipital hemorrhagic infarct on MRI, asymptomatic  Other Active Problems Dementia  Gout   Hospital day # 1  Beulah Gandy DNP, ACNPC-AG  [facility]  ATTENDING NOTE: I reviewed above note and agree with the assessment and plan. Pt was seen and examined.   Daughter at bedside.  Patient lying bed, no complains. Stroke likely due to small vessel disease given stroke risk factors and history of stroke.  Put on DAPT and statin.  MRI showed 5 mm left P-comm aneurysm.  Discussed with Dr. Estanislado Pandy, he will follow-up as outpatient.  Patient daughter are aware.  PT/OT recommend home health.  For detailed assessment and plan, please refer to above/below as I have made changes wherever appropriate.   Neurology will sign off. Please call with questions. Pt will follow up with stroke clinic NP at [facility] in about 4 weeks. Thanks for the consult.   Rosalin Hawking, MD PhD Stroke Neurology 01/30/2023 8:33 PM   To contact Stroke Continuity provider, please refer to [email or web address] After hours, contact General Neurology

## 2023-01-30 NOTE — Evaluation (Signed)
Clinical/Bedside Swallow Evaluation Patient Details  Name: Deseree Jarmin MRN: [id number] Date of Birth: [birth year]  Today's Date: 01/30/2023 Time: SLP Start Time (ACUTE ONLY): 0948 SLP Stop Time (ACUTE ONLY): 1000 SLP Time Calculation (min) (ACUTE ONLY): 12 min  Past Medical History:  Past Medical History:  Diagnosis Date   Allergy    Asthma    Diabetes mellitus without complication ([place])    Gout    Hypertension    Past Surgical History:  Past Surgical History:  Procedure Laterality Date   ABDOMINAL HYSTERECTOMY     BACK SURGERY     COLONOSCOPY     ROTATOR CUFF REPAIR Right    HPI:  Pt is an 82 y.o. female who presented 01/29/23 with intermittent dizziness x2 weeks and slurred speech. MRI showed acute ischemic nonhemorrhagic right pontine infarct. PMH: DM2, dementia, CKD 4, hypothyroidism, obesity, asthma, gout, HTN    Assessment / Plan / Recommendation  Clinical Impression  Pt's oropharyngeal swallow appears to be functional without overt evidence of dysphagia or signs of aspiration. She has also passed a Yale swallow screen, suggestive of lower risk for aspiration. Recommend continuing with regular solids and thin liquids. SLP to sign off. SLP Visit Diagnosis: Dysphagia, unspecified (R13.10)    Aspiration Risk  Mild aspiration risk    Diet Recommendation Regular;Thin liquid   Liquid Administration via: Cup;Straw Medication Administration: Whole meds with liquid Supervision: Patient able to self feed;Intermittent supervision to cue for compensatory strategies Compensations: Minimize environmental distractions;Slow rate;Small sips/bites Postural Changes: Seated upright at 90 degrees    Other  Recommendations Oral Care Recommendations: Oral care BID    Recommendations for follow up therapy are one component of a multi-disciplinary discharge planning process, led by the attending physician.  Recommendations may be updated based on patient status, additional functional  criteria and insurance authorization.  Follow up Recommendations No SLP follow up      Assistance Recommended at Discharge    Functional Status Assessment Patient has not had a recent decline in their functional status  Frequency and Duration            Prognosis        Swallow Study   General HPI: Pt is an 82 y.o. female who presented 01/29/23 with intermittent dizziness x2 weeks and slurred speech. MRI showed acute ischemic nonhemorrhagic right pontine infarct. PMH: DM2, dementia, CKD 4, hypothyroidism, obesity, asthma, gout, HTN Type of Study: Bedside Swallow Evaluation Previous Swallow Assessment: none in chart Diet Prior to this Study: Regular;Thin liquids (Level 0) Temperature Spikes Noted: No Respiratory Status: Room air History of Recent Intubation: No Behavior/Cognition: Alert;Cooperative;Pleasant mood Oral Cavity Assessment: Within Functional Limits Oral Care Completed by SLP: No Oral Cavity - Dentition: Dentures, top;Missing dentition (missing some lower dentition, pt says she does not wear partials (although not a clear historian given h/o dementia)) Vision: Functional for self-feeding Self-Feeding Abilities: Able to feed self Patient Positioning: Upright in chair Baseline Vocal Quality: Normal Volitional Cough: Strong Volitional Swallow: Able to elicit    Oral/Motor/Sensory Function Overall Oral Motor/Sensory Function:  (? mild L facial asymmetry at baseline)   Ice Chips Ice chips: Not tested   Thin Liquid Thin Liquid: Within functional limits Presentation: Cup;Self Fed;Straw    Nectar Thick Nectar Thick Liquid: Not tested   Honey Thick Honey Thick Liquid: Not tested   Puree Puree: Within functional limits Presentation: Self Fed;Spoon   Solid     Solid: Within functional limits Presentation: Laurens  N., M.A. [place] Office [phone]  Secure chat preferred  01/30/2023,10:50 AM

## 2023-01-30 NOTE — Evaluation (Signed)
Speech Language Pathology Evaluation Patient Details Name: Maria Cunningham MRN: [id number] DOB: 1941-03-22 Today's Date: 01/30/2023 Time: 1000-1015 SLP Time Calculation (min) (ACUTE ONLY): 15 min  Problem List:  Patient Active Problem List   Diagnosis Date Noted   CVA (cerebral vascular accident) ([place]) 01/29/2023   Acute kidney injury superimposed on CKD ([place]) 08/20/2022   Syncope 08/19/2022   Asthma    Hypertension    Gout    Type II diabetes mellitus with renal manifestations ([place])    Hypothyroidism    Past Medical History:  Past Medical History:  Diagnosis Date   Allergy    Asthma    Diabetes mellitus without complication ([place])    Gout    Hypertension    Past Surgical History:  Past Surgical History:  Procedure Laterality Date   ABDOMINAL HYSTERECTOMY     BACK SURGERY     COLONOSCOPY     ROTATOR CUFF REPAIR Right    HPI:  Pt is an 82 y.o. female who presented 01/29/23 with intermittent dizziness x2 weeks and slurred speech. MRI showed acute ischemic nonhemorrhagic right pontine infarct. PMH: DM2, dementia, CKD 4, hypothyroidism, obesity, asthma, gout, HTN   Assessment / Plan / Recommendation Clinical Impression  Pt has had acute changes in speech more than cognition per family report, as described by granddaughter in the room. She believes that the clarity of her speech has improved though, and she was intelligible to this unfamiliar listener. Her granddaughter does describe slower speech though, describing slow response times, which could be more related to mentation. She is oriented x2 amd has little awareness and recall of recent hospital events. She does not remember what she had just had for breakfast, but increased her accuracy to almost 100% when given options from SLP. Pt does have a h/o dementia, but given some of the acute changes noted by family over the past week, as well as potential impact on functional safety, recommend [facility] SLP follow up upon discharge.     SLP Assessment  SLP Recommendation/Assessment: All further Speech Lanaguage Pathology  needs can be addressed in the next venue of care SLP Visit Diagnosis: Dysarthria and anarthria (R47.1);Cognitive communication deficit (R41.841)    Recommendations for follow up therapy are one component of a multi-disciplinary discharge planning process, led by the attending physician.  Recommendations may be updated based on patient status, additional functional criteria and insurance authorization.    Follow Up Recommendations  Home health SLP    Assistance Recommended at Discharge  Frequent or constant Supervision/Assistance  Functional Status Assessment Patient has had a recent decline in their functional status and/or demonstrates limited ability to make significant improvements in function in a reasonable and predictable amount of time  Frequency and Duration           SLP Evaluation Cognition  Overall Cognitive Status: Impaired/Different from baseline Arousal/Alertness: Awake/alert Orientation Level: Oriented to person;Oriented to place;Disoriented to time;Disoriented to situation Attention: Sustained Sustained Attention: Impaired Sustained Attention Impairment: Verbal basic Memory: Impaired Memory Impairment: Decreased recall of new information Awareness: Impaired Awareness Impairment: Intellectual impairment Problem Solving: Impaired Problem Solving Impairment: Verbal basic Safety/Judgment: Impaired       Comprehension  Auditory Comprehension Overall Auditory Comprehension: Appears within functional limits for tasks assessed    Expression Expression Primary Mode of Expression: Verbal Verbal Expression Overall Verbal Expression: Appears within functional limits for tasks assessed Written Expression Dominant Hand: Right   Oral / Motor  Oral Motor/Sensory Function Overall Oral Motor/Sensory Function:  (? mild  L facial asymmetry at baseline) Motor Speech Overall Motor Speech:  Other (comment) (granddaughter says it is clearer, but slow)            Osie Bond., M.A. [place] Office [phone]  Secure chat preferred  01/30/2023, 11:00 AM

## 2023-01-31 LAB — HEMOGLOBIN A1C
Hgb A1c MFr Bld: 6 % — ABNORMAL HIGH (ref 4.8–5.6)
Mean Plasma Glucose: 126 mg/dL

## 2023-02-02 ENCOUNTER — Other Ambulatory Visit (HOSPITAL_COMMUNITY): Payer: Self-pay | Admitting: Interventional Radiology

## 2023-02-02 DIAGNOSIS — I671 Cerebral aneurysm, nonruptured: Secondary | ICD-10-CM

## 2023-02-12 ENCOUNTER — Ambulatory Visit (HOSPITAL_COMMUNITY)
Admission: RE | Admit: 2023-02-12 | Discharge: 2023-02-12 | Disposition: A | Discharge status: Home / Self Care | Payer: Medicare Other | Source: Ambulatory Visit | Attending: Interventional Radiology | Admitting: Interventional Radiology

## 2023-02-12 DIAGNOSIS — I671 Cerebral aneurysm, nonruptured: Secondary | ICD-10-CM

## 2023-02-17 HISTORY — PX: IR RADIOLOGIST EVAL & MGMT: IMG5224

## 2023-03-17 ENCOUNTER — Other Ambulatory Visit (HOSPITAL_COMMUNITY): Payer: Self-pay | Admitting: Nephrology

## 2023-03-17 DIAGNOSIS — N184 Chronic kidney disease, stage 4 (severe): Secondary | ICD-10-CM

## 2023-03-17 DIAGNOSIS — E1122 Type 2 diabetes mellitus with diabetic chronic kidney disease: Secondary | ICD-10-CM

## 2023-03-20 ENCOUNTER — Telehealth (HOSPITAL_COMMUNITY): Payer: Self-pay | Admitting: Radiology

## 2023-03-20 NOTE — Telephone Encounter (Signed)
Called pt's granddaughter to schedule her brain aneurysm treatment with Dr. Corliss Skains. Left a VM for her to call me back to discuss. Patient did see nephrology, however, they did not clear her for this procedure. They are doing a thorough work-up on her that has not yet been completed. JM

## 2023-03-30 ENCOUNTER — Ambulatory Visit
Admission: RE | Admit: 2023-03-30 | Discharge: 2023-03-30 | Disposition: A | Discharge status: Home / Self Care | Payer: Medicare Other | Source: Ambulatory Visit | Attending: Nephrology | Admitting: Nephrology

## 2023-03-30 DIAGNOSIS — E1122 Type 2 diabetes mellitus with diabetic chronic kidney disease: Secondary | ICD-10-CM | POA: Diagnosis present

## 2023-03-30 DIAGNOSIS — N184 Chronic kidney disease, stage 4 (severe): Secondary | ICD-10-CM | POA: Diagnosis present

## 2023-04-10 ENCOUNTER — Other Ambulatory Visit (HOSPITAL_COMMUNITY): Payer: Self-pay | Admitting: Interventional Radiology

## 2023-04-10 ENCOUNTER — Telehealth (HOSPITAL_COMMUNITY): Payer: Self-pay | Admitting: Radiology

## 2023-04-10 DIAGNOSIS — I671 Cerebral aneurysm, nonruptured: Secondary | ICD-10-CM

## 2023-04-10 NOTE — Telephone Encounter (Signed)
Called Auriel (granddaughter) to schedule pt's brain aneurysm treatment with Dr. Corliss Skains. Left VM for her to call back. JM

## 2023-05-06 ENCOUNTER — Telehealth (HOSPITAL_COMMUNITY): Payer: Self-pay | Admitting: Radiology

## 2023-05-06 ENCOUNTER — Telehealth (HOSPITAL_COMMUNITY): Payer: Self-pay | Admitting: Student

## 2023-05-06 MED ORDER — CLOPIDOGREL BISULFATE 75 MG PO TABS: 75.0000 mg | ORAL_TABLET | Freq: Every day | ORAL | 0 refills | Status: AC

## 2023-05-06 NOTE — Telephone Encounter (Signed)
Patient scheduled for cerebral angiogram with possible intervention May 25, 2023. Patient instructed to begin taking plavix on July 10. She will take 300 mg (4 tablets) on Day #1 and 75 mg (1 tablet) every day afterwards. Patient instructed to take 1 tablet on the day of her procedure. Instructions discussed with the patient and her granddaughter. Plavix e-prescribed to the [facility] on [address] in [place]. Patient instructed to call our office with any questions/concerns prior to her procedure.  Alwyn Ren, [place] [phone] 05/06/2023, 3:25 PM

## 2023-05-06 NOTE — Telephone Encounter (Signed)
Called Auriel, pt's daughter to schedule brain aneurysm treatment with Dr. Corliss Skains. Left a VM for her to call myself or Morrie Sheldon back to set this up. JM

## 2023-05-22 ENCOUNTER — Other Ambulatory Visit: Payer: Self-pay

## 2023-05-22 ENCOUNTER — Other Ambulatory Visit: Payer: Self-pay | Admitting: Student

## 2023-05-22 ENCOUNTER — Encounter (HOSPITAL_COMMUNITY): Payer: Self-pay | Admitting: Interventional Radiology

## 2023-05-22 ENCOUNTER — Other Ambulatory Visit (HOSPITAL_COMMUNITY): Payer: Self-pay | Admitting: Student

## 2023-05-22 DIAGNOSIS — I671 Cerebral aneurysm, nonruptured: Secondary | ICD-10-CM

## 2023-05-22 NOTE — Progress Notes (Signed)
SDW CALL  Patient was given pre-op instructions over the phone. The opportunity was given for the patient to ask questions. No further questions asked. Patient verbalized understanding of instructions given.   PCP - Radhika,Kalisetti,MD Cardiologist - None Nephrologist - Munsoor Lateef MD   PPM/ICD - denies Device Orders -  Rep Notified -   Chest x-ray - 08/18/22 EKG - 01/29/23 Stress Test - none ECHO - 01/30/23 Cardiac Cath - none  Sleep Study - granddaughter reports pt "used to have sleep apnea" CPAP - no  Fasting Blood Sugar - pt does not know. HGB A1C 6.0 01/31/23 Checks Blood Sugar none times a day - Pt's granddaughter states pt only checks blood sugar when she goes to the doctor, but granddaughter,Auriel, said they will check blood sugar as instructed DOS.  Blood Thinner Instructions:continue Plavix . Take DOS . Aspirin Instructions: continue aspirin. Take DOS.  ERAS Protcol -no PRE-SURGERY Ensure or G2-   COVID TEST- no   Anesthesia review: no  Patient denies shortness of breath, fever, cough and chest pain over the phone call    Surgical Instructions    Your procedure is scheduled on Monday July 15  Report to [facility] Main Entrance "A" at 5:30 A.M., then check in with the Admitting office.  Call this number if you have problems the morning of surgery:  [phone]    Remember:  Do not eat or drink anything after midnight the night before your surgery   Take these medicines the morning of surgery with A SIP OF WATER:  ASPIRIN,PLAVIX,Plendil,Imdur,Synthroid,Advair,Aricept,Allopurinol  As of today, STOP taking any Aleve, Naproxen, Ibuprofen, Motrin, Advil, Goody's, BC's, all herbal medications, fish oil, and all vitamins.   WHAT DO I DO ABOUT MY DIABETES MEDICATION?   Check your blood sugar the morning of your surgery when you wake up and every 2 hours until you get to the Short Stay unit.  If your blood sugar is less than 70 mg/dL, you will need to  treat for low blood sugar: Do not take insulin. Treat a low blood sugar (less than 70 mg/dL) with  cup of clear juice (cranberry or apple), 4 glucose tablets, OR glucose gel. Recheck blood sugar in 15 minutes after treatment (to make sure it is greater than 70 mg/dL). If your blood sugar is not greater than 70 mg/dL on recheck, call [phone] for further instructions.  [facility] is not responsible for any belongings or valuables. .   Do NOT Smoke (Tobacco/Vaping)  24 hours prior to your procedure  If you use a CPAP at night, you may bring your mask for your overnight stay.   Contacts, glasses, hearing aids, dentures or partials may not be worn into surgery, please bring cases for these belongings   Patients discharged the day of surgery will not be allowed to drive home, and someone needs to stay with them for 24 hours.   SURGICAL WAITING ROOM VISITATION You may have 1 visitor in the pre-op area at a time determined by the pre-op nurse.      Special instructions:    Oral Hygiene is also important to reduce your risk of infection.  Remember - BRUSH YOUR TEETH THE MORNING OF SURGERY WITH YOUR REGULAR TOOTHPASTE   Day of Surgery:  Take a shower the day of or night before with antibacterial soap. Wear Clean/Comfortable clothing the morning of surgery Do not apply any deodorants/lotions.   Do not wear jewelry or makeup Do not wear lotions, powders, perfumes/colognes, or deodorant.  Do not shave 48 hours prior to surgery.  Men may shave face and neck. Do not bring valuables to the hospital. Do not wear nail polish, gel polish, artificial nails, or any other type of covering on natural nails (fingers and toes) If you have artificial nails or gel coating that need to be removed by a nail salon, please have this removed prior to surgery. Artificial nails or gel coating may interfere with anesthesia's ability to adequately monitor your vital signs. Remember to brush your teeth WITH  YOUR REGULAR TOOTHPASTE.

## 2023-05-24 NOTE — Anesthesia Preprocedure Evaluation (Addendum)
Anesthesia Evaluation  Patient identified by MRN, date of birth, ID band Patient awake    Reviewed: Allergy & Precautions, NPO status , Patient's Chart, lab work & pertinent test results  History of Anesthesia Complications Negative for: history of anesthetic complications  Airway Mallampati: I  TM Distance: >3 FB Neck ROM: Full    Dental  (+) Edentulous Upper, Missing, Dental Advisory Given   Pulmonary asthma    breath sounds clear to auscultation       Cardiovascular hypertension, Pt. on medications (-) angina  Rhythm:Regular Rate:Normal  01/2023 ECHO: EF 60-65%, normal LVF, mild LVH, Grade 1 DD, normal RVF, trivial MR   Neuro/Psych       Dementia Cerebral aneurysm: for intervention CVA    GI/Hepatic negative GI ROS, Neg liver ROS,,,  Endo/Other  diabetes (glu 94)Hypothyroidism    Renal/GU Renal InsufficiencyRenal disease     Musculoskeletal   Abdominal   Peds  Hematology plavix   Anesthesia Other Findings   Reproductive/Obstetrics                             Anesthesia Physical Anesthesia Plan  ASA: 3  Anesthesia Plan: General   Post-op Pain Management: Tylenol PO (pre-op)*   Induction: Intravenous  PONV Risk Score and Plan: 3 and Ondansetron, Dexamethasone and Treatment may vary due to age or medical condition  Airway Management Planned: Oral ETT  Additional Equipment: Arterial line  Intra-op Plan:   Post-operative Plan: Extubation in OR  Informed Consent: I have reviewed the patients History and Physical, chart, labs and discussed the procedure including the risks, benefits and alternatives for the proposed anesthesia with the patient or authorized representative who has indicated his/her understanding and acceptance.     Dental advisory given  Plan Discussed with: CRNA and Surgeon  Anesthesia Plan Comments:         Anesthesia Quick Evaluation

## 2023-05-25 ENCOUNTER — Other Ambulatory Visit: Payer: Self-pay

## 2023-05-25 ENCOUNTER — Ambulatory Visit (HOSPITAL_COMMUNITY)
Admission: RE | Admit: 2023-05-25 | Discharge: 2023-05-25 | Disposition: A | Discharge status: Home / Self Care | Payer: Medicare Other | Source: Ambulatory Visit | Attending: Interventional Radiology | Admitting: Interventional Radiology

## 2023-05-25 ENCOUNTER — Encounter (HOSPITAL_COMMUNITY)
Admission: RE | Disposition: A | Discharge status: Home / Self Care | Payer: Self-pay | Source: Home / Self Care | Attending: Interventional Radiology

## 2023-05-25 ENCOUNTER — Inpatient Hospital Stay (HOSPITAL_COMMUNITY): Payer: Medicare Other | Admitting: Anesthesiology

## 2023-05-25 ENCOUNTER — Inpatient Hospital Stay (HOSPITAL_COMMUNITY)
Admission: RE | Admit: 2023-05-25 | Discharge: 2023-05-26 | DRG: 027 | Disposition: A | Discharge status: Home / Self Care | Payer: Medicare Other | Attending: Interventional Radiology | Admitting: Interventional Radiology

## 2023-05-25 ENCOUNTER — Encounter (HOSPITAL_COMMUNITY): Payer: Self-pay | Admitting: Interventional Radiology

## 2023-05-25 DIAGNOSIS — Z7902 Long term (current) use of antithrombotics/antiplatelets: Secondary | ICD-10-CM

## 2023-05-25 DIAGNOSIS — E1122 Type 2 diabetes mellitus with diabetic chronic kidney disease: Secondary | ICD-10-CM | POA: Diagnosis present

## 2023-05-25 DIAGNOSIS — Z8249 Family history of ischemic heart disease and other diseases of the circulatory system: Secondary | ICD-10-CM | POA: Diagnosis not present

## 2023-05-25 DIAGNOSIS — Z79890 Hormone replacement therapy (postmenopausal): Secondary | ICD-10-CM | POA: Diagnosis not present

## 2023-05-25 DIAGNOSIS — I129 Hypertensive chronic kidney disease with stage 1 through stage 4 chronic kidney disease, or unspecified chronic kidney disease: Secondary | ICD-10-CM | POA: Insufficient documentation

## 2023-05-25 DIAGNOSIS — I6930 Unspecified sequelae of cerebral infarction: Secondary | ICD-10-CM

## 2023-05-25 DIAGNOSIS — E039 Hypothyroidism, unspecified: Secondary | ICD-10-CM

## 2023-05-25 DIAGNOSIS — Z79899 Other long term (current) drug therapy: Secondary | ICD-10-CM

## 2023-05-25 DIAGNOSIS — Z7982 Long term (current) use of aspirin: Secondary | ICD-10-CM

## 2023-05-25 DIAGNOSIS — Z7951 Long term (current) use of inhaled steroids: Secondary | ICD-10-CM | POA: Diagnosis not present

## 2023-05-25 DIAGNOSIS — N189 Chronic kidney disease, unspecified: Secondary | ICD-10-CM | POA: Insufficient documentation

## 2023-05-25 DIAGNOSIS — Z888 Allergy status to other drugs, medicaments and biological substances status: Secondary | ICD-10-CM

## 2023-05-25 DIAGNOSIS — I671 Cerebral aneurysm, nonruptured: Secondary | ICD-10-CM

## 2023-05-25 DIAGNOSIS — F0390 Unspecified dementia without behavioral disturbance: Secondary | ICD-10-CM | POA: Diagnosis present

## 2023-05-25 DIAGNOSIS — M109 Gout, unspecified: Secondary | ICD-10-CM | POA: Diagnosis present

## 2023-05-25 DIAGNOSIS — Z8673 Personal history of transient ischemic attack (TIA), and cerebral infarction without residual deficits: Secondary | ICD-10-CM | POA: Insufficient documentation

## 2023-05-25 DIAGNOSIS — J45909 Unspecified asthma, uncomplicated: Secondary | ICD-10-CM | POA: Diagnosis present

## 2023-05-25 DIAGNOSIS — Z90710 Acquired absence of both cervix and uterus: Secondary | ICD-10-CM

## 2023-05-25 DIAGNOSIS — I6522 Occlusion and stenosis of left carotid artery: Secondary | ICD-10-CM | POA: Diagnosis present

## 2023-05-25 HISTORY — PX: IR 3D INDEPENDENT WKST: IMG2385

## 2023-05-25 HISTORY — DX: Unspecified dementia, unspecified severity, without behavioral disturbance, psychotic disturbance, mood disturbance, and anxiety: F03.90

## 2023-05-25 HISTORY — DX: Chronic kidney disease, unspecified: N18.9

## 2023-05-25 HISTORY — PX: IR TRANSCATH/EMBOLIZ: IMG695

## 2023-05-25 HISTORY — PX: IR CT HEAD LTD: IMG2386

## 2023-05-25 HISTORY — DX: Hypothyroidism, unspecified: E03.9

## 2023-05-25 HISTORY — DX: Cerebral infarction, unspecified: I63.9

## 2023-05-25 HISTORY — PX: IR ANGIO INTRA EXTRACRAN SEL INTERNAL CAROTID UNI L MOD SED: IMG5361

## 2023-05-25 HISTORY — PX: RADIOLOGY WITH ANESTHESIA: SHX6223

## 2023-05-25 LAB — BASIC METABOLIC PANEL
Anion gap: 13 (ref 5–15)
BUN: 34 mg/dL — ABNORMAL HIGH (ref 8–23)
CO2: 20 mmol/L — ABNORMAL LOW (ref 22–32)
Calcium: 8.9 mg/dL (ref 8.9–10.3)
Chloride: 106 mmol/L (ref 98–111)
Creatinine, Ser: 2.02 mg/dL — ABNORMAL HIGH (ref 0.44–1.00)
GFR, Estimated: 24 mL/min — ABNORMAL LOW (ref >60)
Glucose, Bld: 94 mg/dL (ref 70–99)
Potassium: 3.8 mmol/L (ref 3.5–5.1)
Sodium: 139 mmol/L (ref 135–145)

## 2023-05-25 LAB — POCT ACTIVATED CLOTTING TIME
Activated Clotting Time: 201 seconds
Activated Clotting Time: 189 seconds

## 2023-05-25 LAB — CBC WITH DIFFERENTIAL/PLATELET
Abs Immature Granulocytes: 0.02 10*3/uL (ref 0.00–0.07)
Basophils Absolute: 0.1 10*3/uL (ref 0.0–0.1)
Basophils Relative: 1 %
Eosinophils Absolute: 0.8 10*3/uL — ABNORMAL HIGH (ref 0.0–0.5)
Eosinophils Relative: 9 %
HCT: 30.9 % — ABNORMAL LOW (ref 36.0–46.0)
Hemoglobin: 10 g/dL — ABNORMAL LOW (ref 12.0–15.0)
Immature Granulocytes: 0 %
Lymphocytes Relative: 31 %
Lymphs Abs: 2.6 10*3/uL (ref 0.7–4.0)
MCH: 29.7 pg (ref 26.0–34.0)
MCHC: 32.4 g/dL (ref 30.0–36.0)
MCV: 91.7 fL (ref 80.0–100.0)
Monocytes Absolute: 0.6 10*3/uL (ref 0.1–1.0)
Monocytes Relative: 7 %
Neutro Abs: 4.2 10*3/uL (ref 1.7–7.7)
Neutrophils Relative %: 52 %
Platelets: 213 10*3/uL (ref 150–400)
RBC: 3.37 MIL/uL — ABNORMAL LOW (ref 3.87–5.11)
RDW: 15.6 % — ABNORMAL HIGH (ref 11.5–15.5)
WBC: 8.3 10*3/uL (ref 4.0–10.5)
nRBC: 0 % (ref 0.0–0.2)

## 2023-05-25 LAB — GLUCOSE, CAPILLARY
Glucose-Capillary: 94 mg/dL (ref 70–99)
Glucose-Capillary: 89 mg/dL (ref 70–99)

## 2023-05-25 LAB — PROTIME-INR
INR: 1.1 (ref 0.8–1.2)
Prothrombin Time: 13.9 seconds (ref 11.4–15.2)

## 2023-05-25 LAB — MRSA NEXT GEN BY PCR, NASAL: MRSA by PCR Next Gen: NOT DETECTED

## 2023-05-25 SURGERY — IR WITH ANESTHESIA
Anesthesia: General

## 2023-05-25 MED ORDER — CHLORHEXIDINE GLUCONATE 0.12 % MT SOLN
15.0000 mL | Freq: Once | OROMUCOSAL | Status: AC
  Administered 2023-05-25: 15 mL via OROMUCOSAL
  Filled 2023-05-25: qty 15

## 2023-05-25 MED ORDER — DEXAMETHASONE SODIUM PHOSPHATE 10 MG/ML IJ SOLN
INTRAMUSCULAR | Status: DC | PRN
  Administered 2023-05-25: 5 mg via INTRAVENOUS

## 2023-05-25 MED ORDER — FENTANYL CITRATE (PF) 100 MCG/2ML IJ SOLN: 25.0000 ug | INTRAMUSCULAR | Status: DC | PRN

## 2023-05-25 MED ORDER — ROCURONIUM BROMIDE 10 MG/ML (PF) SYRINGE
PREFILLED_SYRINGE | INTRAVENOUS | Status: DC | PRN
  Administered 2023-05-25: 60 mg via INTRAVENOUS
  Administered 2023-05-25: 40 mg via INTRAVENOUS

## 2023-05-25 MED ORDER — LACTATED RINGERS IV SOLN: INTRAVENOUS | Status: DC

## 2023-05-25 MED ORDER — CLEVIDIPINE BUTYRATE 0.5 MG/ML IV EMUL
INTRAVENOUS | Status: AC
  Filled 2023-05-25: qty 50

## 2023-05-25 MED ORDER — CLOPIDOGREL BISULFATE 75 MG PO TABS: 75.0000 mg | ORAL_TABLET | Freq: Every day | ORAL | Status: DC

## 2023-05-25 MED ORDER — SODIUM CHLORIDE 0.9 % IV SOLN: INTRAVENOUS | Status: DC

## 2023-05-25 MED ORDER — EPTIFIBATIDE 20 MG/10ML IV SOLN
INTRAVENOUS | Status: AC | PRN
  Administered 2023-05-25 (×2): 1.5 mg via INTRA_ARTERIAL

## 2023-05-25 MED ORDER — ACETAMINOPHEN 325 MG PO TABS: 650.0000 mg | ORAL_TABLET | ORAL | Status: DC | PRN

## 2023-05-25 MED ORDER — CEFAZOLIN SODIUM-DEXTROSE 2-4 GM/100ML-% IV SOLN
2.0000 g | INTRAVENOUS | Status: AC
  Administered 2023-05-25: 2 g via INTRAVENOUS
  Filled 2023-05-25 (×2): qty 100

## 2023-05-25 MED ORDER — CLOPIDOGREL BISULFATE 75 MG PO TABS
75.0000 mg | ORAL_TABLET | Freq: Every day | ORAL | Status: DC
  Administered 2023-05-26: 75 mg via ORAL
  Filled 2023-05-25: qty 1

## 2023-05-25 MED ORDER — IOHEXOL 300 MG/ML  SOLN
150.0000 mL | Freq: Once | INTRAMUSCULAR | Status: AC | PRN
  Administered 2023-05-25: 40 mL via INTRA_ARTERIAL

## 2023-05-25 MED ORDER — INSULIN ASPART 100 UNIT/ML IJ SOLN: 0.0000 [IU] | INTRAMUSCULAR | Status: DC | PRN

## 2023-05-25 MED ORDER — CLEVIDIPINE BUTYRATE 0.5 MG/ML IV EMUL
0.0000 mg/h | INTRAVENOUS | Status: AC
  Administered 2023-05-25: 6 mg/h via INTRAVENOUS
  Administered 2023-05-25 (×2): 16 mg/h via INTRAVENOUS
  Administered 2023-05-26: 15 mg/h via INTRAVENOUS
  Administered 2023-05-26: 17 mg/h via INTRAVENOUS
  Administered 2023-05-26: 14 mg/h via INTRAVENOUS
  Administered 2023-05-26 (×2): 16 mg/h via INTRAVENOUS
  Filled 2023-05-25: qty 100
  Filled 2023-05-25 (×3): qty 50
  Filled 2023-05-25: qty 100
  Filled 2023-05-25 (×3): qty 50

## 2023-05-25 MED ORDER — HEPARIN SODIUM (PORCINE) 1000 UNIT/ML IJ SOLN
INTRAMUSCULAR | Status: DC | PRN
  Administered 2023-05-25: 3000 [IU] via INTRAVENOUS

## 2023-05-25 MED ORDER — FENTANYL CITRATE (PF) 250 MCG/5ML IJ SOLN
INTRAMUSCULAR | Status: DC | PRN
  Administered 2023-05-25: 200 ug via INTRAVENOUS

## 2023-05-25 MED ORDER — PROPOFOL 10 MG/ML IV BOLUS
INTRAVENOUS | Status: DC | PRN
  Administered 2023-05-25: 50 mg via INTRAVENOUS
  Administered 2023-05-25: 20 mg via INTRAVENOUS
  Administered 2023-05-25: 80 mg via INTRAVENOUS

## 2023-05-25 MED ORDER — ORAL CARE MOUTH RINSE: 15.0000 mL | Freq: Once | OROMUCOSAL | Status: AC

## 2023-05-25 MED ORDER — GABAPENTIN 100 MG PO CAPS
200.0000 mg | ORAL_CAPSULE | Freq: Every day | ORAL | Status: DC
  Administered 2023-05-25: 200 mg via ORAL
  Filled 2023-05-25: qty 2

## 2023-05-25 MED ORDER — CHLORHEXIDINE GLUCONATE CLOTH 2 % EX PADS
6.0000 | MEDICATED_PAD | Freq: Every day | CUTANEOUS | Status: DC
  Administered 2023-05-25: 6 via TOPICAL

## 2023-05-25 MED ORDER — ACETAMINOPHEN 500 MG PO TABS
1000.0000 mg | ORAL_TABLET | Freq: Once | ORAL | Status: AC
  Administered 2023-05-25: 1000 mg via ORAL
  Filled 2023-05-25: qty 2

## 2023-05-25 MED ORDER — CLEVIDIPINE BUTYRATE 0.5 MG/ML IV EMUL
INTRAVENOUS | Status: DC | PRN
  Administered 2023-05-25: 2 mg/h via INTRAVENOUS

## 2023-05-25 MED ORDER — ESMOLOL HCL 100 MG/10ML IV SOLN
INTRAVENOUS | Status: DC | PRN
  Administered 2023-05-25: 40 mg via INTRAVENOUS

## 2023-05-25 MED ORDER — LABETALOL HCL 5 MG/ML IV SOLN
INTRAVENOUS | Status: DC | PRN
  Administered 2023-05-25: 5 mg via INTRAVENOUS
  Administered 2023-05-25: 15 mg via INTRAVENOUS

## 2023-05-25 MED ORDER — HEPARIN (PORCINE) 25000 UT/250ML-% IV SOLN
650.0000 [IU]/h | INTRAVENOUS | Status: DC
  Filled 2023-05-25: qty 250

## 2023-05-25 MED ORDER — FENTANYL CITRATE (PF) 100 MCG/2ML IJ SOLN
INTRAMUSCULAR | Status: AC
  Filled 2023-05-25: qty 2

## 2023-05-25 MED ORDER — HEPARIN (PORCINE) 25000 UT/250ML-% IV SOLN
INTRAVENOUS | Status: AC
  Filled 2023-05-25: qty 250

## 2023-05-25 MED ORDER — ASPIRIN 81 MG PO CHEW
81.0000 mg | CHEWABLE_TABLET | Freq: Every day | ORAL | Status: DC
  Administered 2023-05-26: 81 mg via ORAL
  Filled 2023-05-25: qty 1

## 2023-05-25 MED ORDER — ACETAMINOPHEN 160 MG/5ML PO SOLN: 650.0000 mg | ORAL | Status: DC | PRN

## 2023-05-25 MED ORDER — HEPARIN (PORCINE) 25000 UT/250ML-% IV SOLN: 650.0000 [IU]/h | INTRAVENOUS | Status: DC

## 2023-05-25 MED ORDER — ONDANSETRON HCL 4 MG/2ML IJ SOLN
INTRAMUSCULAR | Status: DC | PRN
  Administered 2023-05-25: 4 mg via INTRAVENOUS

## 2023-05-25 MED ORDER — NITROGLYCERIN 1 MG/10 ML FOR IR/CATH LAB
INTRA_ARTERIAL | Status: AC
  Filled 2023-05-25: qty 10

## 2023-05-25 MED ORDER — EPTIFIBATIDE 20 MG/10ML IV SOLN
INTRAVENOUS | Status: AC
  Filled 2023-05-25: qty 10

## 2023-05-25 MED ORDER — LIDOCAINE 2% (20 MG/ML) 5 ML SYRINGE
INTRAMUSCULAR | Status: DC | PRN
  Administered 2023-05-25: 60 mg via INTRAVENOUS

## 2023-05-25 MED ORDER — SUGAMMADEX SODIUM 200 MG/2ML IV SOLN
INTRAVENOUS | Status: DC | PRN
  Administered 2023-05-25: 200 mg via INTRAVENOUS

## 2023-05-25 MED ORDER — ASPIRIN 81 MG PO CHEW: 81.0000 mg | CHEWABLE_TABLET | Freq: Every day | ORAL | Status: DC

## 2023-05-25 MED ORDER — IOHEXOL 300 MG/ML  SOLN
150.0000 mL | Freq: Once | INTRAMUSCULAR | Status: AC | PRN
  Administered 2023-05-25: 21 mL via INTRA_ARTERIAL

## 2023-05-25 MED ORDER — PHENYLEPHRINE HCL-NACL 20-0.9 MG/250ML-% IV SOLN
INTRAVENOUS | Status: DC | PRN
  Administered 2023-05-25: 60 ug/min via INTRAVENOUS

## 2023-05-25 MED ORDER — ACETAMINOPHEN 650 MG RE SUPP: 650.0000 mg | RECTAL | Status: DC | PRN

## 2023-05-25 MED ORDER — HEPARIN (PORCINE) 25000 UT/250ML-% IV SOLN
500.0000 [IU]/h | INTRAVENOUS | Status: DC
  Administered 2023-05-25: 500 [IU]/h via INTRAVENOUS

## 2023-05-25 MED ORDER — IOHEXOL 300 MG/ML  SOLN
100.0000 mL | Freq: Once | INTRAMUSCULAR | Status: AC | PRN
  Administered 2023-05-25: 40 mL via INTRA_ARTERIAL

## 2023-05-25 NOTE — Progress Notes (Signed)
Patient s/p left ICA balloon angioplasty with Dr. Corliss Skains. She was seen post-procedure in the Neuro ICU. Her granddaughter is at the bedside.  Patient is awake, alert, able to follow commands and answer questions appropriately. Neurological exam within normal range. Right groin site covered with a pressure dressing. Bilateral pedal pulses with strong doppler signal per RN. Patient has approximately 1 more hour on her bedrest.   Patient will continue with IV heparin until tomorrow morning and will then be started on aspirin and plavix. Dr. Corliss Skains will see the patient in the morning for possible discharge home.   Ok to DC arterial line. Patient's evening dose of gabapentin has been ordered.  Please call IR with any questions.  Alwyn Ren, [place] [phone] 05/25/2023, 3:49 PM

## 2023-05-25 NOTE — Final Progress Note (Signed)
Pressure dressing right groin. Hematoma @end  of procedure pressure held , Patient tolerated well no bleeding on dressing noted. Applied knee immobilizer right knee.

## 2023-05-25 NOTE — H&P (Signed)
  The note originally documented on this encounter has been moved the the encounter in which it belongs.

## 2023-05-25 NOTE — Anesthesia Procedure Notes (Signed)
Procedure Name: Intubation Date/Time: 05/25/2023 8:34 AM  Performed by: Alease Medina, CRNAPre-anesthesia Checklist: Patient identified, Emergency Drugs available, Suction available and Patient being monitored Patient Re-evaluated:Patient Re-evaluated prior to induction Oxygen Delivery Method: Circle system utilized Preoxygenation: Pre-oxygenation with 100% oxygen Induction Type: IV induction Ventilation: Mask ventilation without difficulty and Oral airway inserted - appropriate to patient size Laryngoscope Size: Mac and 3 Grade View: Grade I Tube type: Oral Tube size: 7.0 mm Number of attempts: 1 Airway Equipment and Method: Stylet and Oral airway Placement Confirmation: ETT inserted through vocal cords under direct vision, positive ETCO2 and breath sounds checked- equal and bilateral Secured at: 22 cm Tube secured with: Tape Dental Injury: Teeth and Oropharynx as per pre-operative assessment

## 2023-05-25 NOTE — Transfer of Care (Signed)
Immediate Anesthesia Transfer of Care Note  Patient: Maria Cunningham  Procedure(s) Performed: Brain aneurysm embolization  Patient Location: PACU  Anesthesia Type:General  Level of Consciousness: drowsy and patient cooperative  Airway & Oxygen Therapy: Patient Spontanous Breathing and Patient connected to nasal cannula oxygen  Post-op Assessment: Report given to RN, Post -op Vital signs reviewed and stable, and Patient moving all extremities X 4  Post vital signs: Reviewed and stable  Last Vitals:  Vitals Value Taken Time  BP 168/59 05/25/23 1105  Temp    Pulse 54 05/25/23 1110  Resp 11 05/25/23 1110  SpO2 100 % 05/25/23 1110  Vitals shown include unfiled device data.  Last Pain:  Vitals:   05/25/23 0618  TempSrc:   PainSc: 0-No pain         Complications: No notable events documented. Cleviprex started for goal Systolic BP 120-140

## 2023-05-25 NOTE — Progress Notes (Signed)
Orthopedic Tech Progress Note Patient Details:  Maria Cunningham 08-Nov-1941 [id number]  IR RN called requesting a KNEE IMMOBILIZER for patient   Ortho Devices Type of Ortho Device: Knee Immobilizer Ortho Device/Splint Interventions: Other (comment)   Post Interventions Patient Tolerated: Other (comment) Instructions Provided: Other (comment)  Donald Pore 05/25/2023, 12:22 PM

## 2023-05-25 NOTE — Procedures (Signed)
INR.  Status post the left common carotid arteriogram.  The right CFA approach.  Findings.  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  Status post balloon angioplasty of high-grade left ICA cavernous segment with a residual 40% stenosis.  Placement of a 3.5 mm x 14 mm pipeline vantage flow diverter device across the wide neck left ICA terminal aneurysm with stasis.  Post CT brain no evidence of intracranial hemorrhage 8 French Angio-Seal closure device deployed for hemostasis a the right groin puncture site.  Distal pulses dopplerable in both feet unchanged from prior to procedure.  Patient extubated.  Denies any H/As ,N/V  Follows commands appropriately. Pupils 3 mm rt = Lt sluggishly reactive. No facial asymmetry. Tongue midline.  S.Minh Jasper MD

## 2023-05-25 NOTE — Anesthesia Procedure Notes (Signed)
Arterial Line Insertion Start/End7/15/2024 7:45 AM, 05/25/2023 8:00 AM Performed by: Alease Medina, CRNA, CRNA  Patient location: Pre-op. Preanesthetic checklist: patient identified, IV checked, site marked, risks and benefits discussed, surgical consent, monitors and equipment checked, pre-op evaluation, timeout performed and anesthesia consent Lidocaine 1% used for infiltration Left, radial was placed Catheter size: 20 G Hand hygiene performed  and maximum sterile barriers used  Allen's test indicative of satisfactory collateral circulation Attempts: 1 Procedure performed using ultrasound guided technique. Ultrasound Notes:anatomy identified, needle tip was noted to be adjacent to the nerve/plexus identified and no ultrasound evidence of intravascular and/or intraneural injection Following insertion, Biopatch and dressing applied. Post procedure assessment: normal  Patient tolerated the procedure well with no immediate complications.

## 2023-05-25 NOTE — Anesthesia Postprocedure Evaluation (Addendum)
Anesthesia Post Note  Patient: Maria Cunningham  Procedure(s) Performed: Brain aneurysm embolization     Patient location during evaluation: PACU Anesthesia Type: General Level of consciousness: awake and alert, patient cooperative and oriented Pain management: pain level controlled Vital Signs Assessment: post-procedure vital signs reviewed and stable Respiratory status: spontaneous breathing, nonlabored ventilation and respiratory function stable Cardiovascular status: stable (on Cleviprex for BP control) Postop Assessment: no apparent nausea or vomiting Anesthetic complications: no   No notable events documented.  Last Vitals:  Vitals:   05/25/23 1130 05/25/23 1145  BP: (!) 104/45 (!) 139/56  Pulse: (!) 48 (!) 51  Resp: 14 13  Temp:    SpO2: 99% 96%    Last Pain:  Vitals:   05/25/23 1130  TempSrc:   PainSc: 0-No pain                 Marli Diego,E. Stina Gane

## 2023-05-25 NOTE — Progress Notes (Signed)
Patient ID: Maria Cunningham, female   DOB: [birth year], 82 y.o.   MRN: [id number] INR.  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  Patient seen and examined.  Chart reviewed.   Patient reports no recent new neurological symptoms.  As per patient's daughter, her neurological condition has remained  stable.  No recent chest pain ,shortness of breath breathing difficulties, diarrhea or melena.No recent chills or fever or rigors. On examination  No gross lateralizing neurologic abnormalities detected.  Patient oriented to place and month and time. Both the patient and the daughter are aware that the initial portion of the procedure will be a diagnostic arteriogram.  Depending on the angiogram findings, endovascular treatment of  either the aneurysm or the suspected high-grade left ICA stenosis will be undertaken given the patient's chronic renal failure.  This is to avoid potential worsening of renal function which could result in acute renal failure.  Both the patient and the daughter are aware of the potential complications of vessel injury ,thromboembolic stroke, worsening kidney function requiring dialysis ,and  potential for delayed intracranial hemorrhage.  Questions were answered to their satisfaction.  Informed witnessed consent was obtained.  Maria Oplinger MD

## 2023-05-25 NOTE — Sedation Documentation (Signed)
 ACT 189

## 2023-05-25 NOTE — Progress Notes (Signed)
ANTICOAGULATION CONSULT NOTE  Pharmacy Consult for heparin Indication:  Post Interventional Neuroradiology Procedure  Heparin Dosing Weight: 77.4 kg  Labs: Recent Labs    05/25/23 0640  HGB 10.0*  HCT 30.9*  PLT 213  LABPROT 13.9  INR 1.1  CREATININE 2.02*    Assessment: 47 yof with aneurysm, severe stenosis of L ICA, now s/p interventional neuroradiology procedure 7/15. Pharmacy consulted to dose heparin per post-neuro IR protocol. Heparin started in PACU at 500 units/hr. Hg 10, plt wnl. Noted renal dysfunction (appears to be near baseline). No bleed issues reported. Patient is not on anticoagulation PTA.  Goal of Therapy:  Heparin level 0.1-0.25 units/ml Monitor platelets by anticoagulation protocol: Yes   Plan:  Increase heparin to 650 units/hr Check 8hr heparin level Monitor CBC, s/sx bleeding Heparin off at 0800 on 7/16 per protocol   Leia Alf, PharmD, BCPS Clinical Pharmacist 05/25/2023 1:01 PM

## 2023-05-25 NOTE — H&P (Signed)
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  Referring Physician(s): Deveshwar,Sanjeev  Supervising Physician: Julieanne Cotton  Patient Status: [facility] - Out-pt  History of Present Illness: Maria Cunningham is a 82 y.o. 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 Maria Cunningham was admitted 01/29/23 with complaints of dysarthria, slurred speech and dizziness. 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 She was discharged with conservative management and referred to [facility] for possible intervention. She and her daughter met with Dr. Corliss Skains and have thorough discussion decision was made to proceed with endovascular intervention.  Past Medical History:  Diagnosis Date   Allergy    Asthma    Chronic kidney disease    Dementia (HCC)    Diabetes mellitus without complication (HCC)    Gout    Hypertension    Hypothyroidism    Stroke [facility])     Past Surgical History:  Procedure Laterality Date   ABDOMINAL HYSTERECTOMY     BACK SURGERY     COLONOSCOPY     IR RADIOLOGIST EVAL & MGMT  02/17/2023   ROTATOR CUFF REPAIR Right     Allergies: Ezetimibe, Lovastatin, Amlodipine, and Quinapril  Medications: Prior to Admission medications   Medication Sig Start Date End Date Taking? Authorizing Provider  allopurinol (ZYLOPRIM) 100 MG tablet Take 100 mg by mouth daily. 12/04/19   [provider]  aspirin EC 81 MG tablet Take 81 mg by mouth daily.    [provider]  atorvastatin (LIPITOR) 40 MG tablet Take 1 tablet (40 mg total) by mouth daily. Patient not taking: Reported on 05/22/2023 01/31/23 03/02/23  Dorcas Carrow, MD  clopidogrel (PLAVIX) 75 MG tablet Take 1 tablet (75 mg total) by mouth daily for 33 doses. Take 4 tablets (300 mg) on day #1. Take one tablet  (75 mg) each day afterwards. 05/06/23 06/08/23  Mickie Kay, NP  donepezil (ARICEPT) 5 MG tablet Take 5 mg by mouth daily. 01/08/23   [provider]  felodipine (PLENDIL) 5 MG 24 hr tablet Take 10 mg by mouth daily. 07/30/22   [provider]  Fluticasone-Salmeterol (ADVAIR) 250-50 MCG/DOSE AEPB Inhale 1 puff into the lungs in the morning and at bedtime.    [provider]  gabapentin (NEURONTIN) 100 MG capsule Take 200 mg by mouth at bedtime. 07/30/22 07/30/23  [provider]  isosorbide mononitrate (IMDUR) 30 MG 24 hr tablet Take 30 mg by mouth daily. 11/22/22   [provider]  levothyroxine (SYNTHROID) 50 MCG tablet Take 50 mcg by mouth daily before breakfast.    [provider]  telmisartan-hydrochlorothiazide (MICARDIS HCT) 40-12.5 MG tablet Take 1 tablet by mouth daily. 10/24/22   [provider]     Family History  Problem Relation Age of Onset   Hypertension Sister     Social History   Socioeconomic History   Marital status: Widowed    Spouse name: Not on file   Number of children: Not on file   Years of education: Not on file   Highest education level: Not on file  Occupational History   Not on file  Tobacco Use   Smoking status: Never  Smokeless tobacco: Never  Vaping Use   Vaping status: Never Used  Substance and Sexual Activity   Alcohol use: No   Drug use: No   Sexual activity: Not on file  Other Topics Concern   Not on file  Social History Narrative   Not on file   Social Determinants of Health   Financial Resource Strain: Low Risk  (01/13/2023)   Received [facility]   Overall Financial Resource Strain (CARDIA)    Difficulty of Paying Living Expenses: Not hard at all  Food Insecurity: No  Food Insecurity (01/30/2023)   Hunger Vital Sign    Worried About Running Out of Food in the Last Year: Never true    Ran Out of Food in the Last Year: Never true  Transportation Needs: No Transportation Needs (01/30/2023)   PRAPARE - Administrator, Civil Service (Medical): No    Lack of Transportation (Non-Medical): No  Physical Activity: Not on file  Stress: Not on file  Social Connections: Not on file     Review of Systems: A 12 point ROS discussed and pertinent positives are indicated in the HPI above.  All other systems are negative.  Review of Systems  Constitutional:  Negative for chills and fever.  Respiratory:  Negative for cough and shortness of breath.   Cardiovascular:  Negative for chest pain.  Gastrointestinal:  Negative for abdominal pain, blood in stool, diarrhea, nausea and vomiting.  Musculoskeletal:  Negative for back pain.  Neurological:  Negative for dizziness, facial asymmetry, speech difficulty, weakness, light-headedness, numbness and headaches.    Vital Signs: There were no vitals taken for this visit.  Physical Exam Vitals reviewed.  Constitutional:      General: She is not in acute distress. HENT:     Head: Normocephalic.  Cardiovascular:     Rate and Rhythm: Normal rate and regular rhythm.  Pulmonary:     Effort: Pulmonary effort is normal.     Breath sounds: Normal breath sounds.  Abdominal:     General: There is no distension.     Palpations: Abdomen is soft.     Tenderness: There is no abdominal tenderness.  Skin:    General: Skin is warm and dry.  Neurological:     Mental Status: She is alert.   Alert, awake, and oriented to self and "hospital" only. Unable to state daughter's address besides "[place]", unable to state month, states years is 2024 after mild coaching.  Speech and comprehension in tact PERRL bilaterally EOMs without nystagmus or subjective diplopia. Visual fields grossly in tact No facial  asymmetry. Tongue midline Motor power - moves all 4 extremities spontaneously    Imaging: No results found.  Labs:  CBC: Recent Labs    08/18/22 2246 01/29/23 1350 01/30/23 0447 05/25/23 0640  WBC 9.0 8.8 7.8 8.3  HGB 11.2* 11.2* 10.4* 10.0*  HCT 35.7* 34.2* 31.0* 30.9*  PLT 244 294 267 213    COAGS: Recent Labs    01/29/23 1350 05/25/23 0640  INR 1.1 1.1  APTT 30  --     BMP: Recent Labs    08/20/22 0451 01/29/23 1350 01/30/23 0447 05/25/23 0640  NA 142 138 137 139  K 3.9 4.4 3.8 3.8  CL 112* 104 107 106  CO2 23 23 22  20*  GLUCOSE 89 122* 85 94  BUN 34* 38* 34* 34*  CALCIUM 9.3 10.2 9.0 8.9  CREATININE 1.72* 2.21* 1.96* 2.02*  GFRNONAA 29* 22* 25* 24*    LIVER FUNCTION TESTS: Recent Labs    01/29/23 1350 01/30/23 0447  BILITOT 0.6 0.5  AST 21 17  ALT 15 15  ALKPHOS 72 73  PROT 8.0 6.6  ALBUMIN 4.4 3.3*    TUMOR MARKERS: No results for input(s): "AFPTM", "CEA", "CA199", "CHROMGRNA" in the last 8760 hours.  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 Patient and daughter are aware this procedure uses general anesthesia and requires overnight admission.  Risks and benefits of cerebral arteriogram with intervention were discussed with the patient including, but not limited to bleeding, infection, vascular injury, contrast induced renal failure, stroke, reperfusion hemorrhage, or even death.  This interventional procedure involves the use of X-rays and because of the nature of the planned procedure, it is possible that we will have prolonged use of X-ray fluoroscopy. Potential radiation risks to you include (but are not limited to) the following: - A slightly elevated risk for cancer  several years later in life. This risk is typically less than 0.5% percent. This risk is low in comparison to the  normal incidence of human cancer, which is 33% for women and 50% for men according to the [employer] Cancer Society. - Radiation induced injury can include skin redness, resembling a rash, tissue breakdown / ulcers and hair loss (which can be temporary or permanent).  The likelihood of either of these occurring depends on the difficulty of the procedure and whether you are sensitive to radiation due to previous procedures, disease, or genetic conditions.  IF your procedure requires a prolonged use of radiation, you will be notified and given written instructions for further action.  It is your responsibility to monitor the irradiated area for the 2 weeks following the procedure and to notify your physician if you are concerned that you have suffered a radiation induced injury.    All of the patient's questions were answered, patient is agreeable to proceed.  Consent signed and in chart.  Thank you for this interesting consult.  I greatly enjoyed meeting Raeleigh Guinn and look forward to participating in their care.  A copy of this report was sent to the requesting provider on this date.  Electronically Signed: Villa Herb, PA-C 05/25/2023, 8:08 AM   I spent a total of  40 Minutes  in face to face in clinical consultation, greater than 50% of which was counseling/coordinating care for left ICA PCOM aneurysm.

## 2023-05-25 NOTE — Sedation Documentation (Signed)
 ACT = 201

## 2023-05-26 ENCOUNTER — Encounter (HOSPITAL_COMMUNITY): Payer: Self-pay | Admitting: Interventional Radiology

## 2023-05-26 LAB — CBC WITH DIFFERENTIAL/PLATELET
Abs Immature Granulocytes: 0.05 10*3/uL (ref 0.00–0.07)
Basophils Absolute: 0 10*3/uL (ref 0.0–0.1)
Basophils Relative: 0 %
Eosinophils Absolute: 0 10*3/uL (ref 0.0–0.5)
Eosinophils Relative: 0 %
HCT: 29.2 % — ABNORMAL LOW (ref 36.0–46.0)
Hemoglobin: 9.3 g/dL — ABNORMAL LOW (ref 12.0–15.0)
Immature Granulocytes: 1 %
Lymphocytes Relative: 14 %
Lymphs Abs: 1.4 10*3/uL (ref 0.7–4.0)
MCH: 29.5 pg (ref 26.0–34.0)
MCHC: 31.8 g/dL (ref 30.0–36.0)
MCV: 92.7 fL (ref 80.0–100.0)
Monocytes Absolute: 0.5 10*3/uL (ref 0.1–1.0)
Monocytes Relative: 5 %
Neutro Abs: 7.6 10*3/uL (ref 1.7–7.7)
Neutrophils Relative %: 80 %
Platelets: 212 10*3/uL (ref 150–400)
RBC: 3.15 MIL/uL — ABNORMAL LOW (ref 3.87–5.11)
RDW: 15.7 % — ABNORMAL HIGH (ref 11.5–15.5)
WBC: 9.6 10*3/uL (ref 4.0–10.5)
nRBC: 0 % (ref 0.0–0.2)

## 2023-05-26 LAB — BASIC METABOLIC PANEL
Anion gap: 8 (ref 5–15)
BUN: 32 mg/dL — ABNORMAL HIGH (ref 8–23)
CO2: 18 mmol/L — ABNORMAL LOW (ref 22–32)
Calcium: 8.5 mg/dL — ABNORMAL LOW (ref 8.9–10.3)
Chloride: 110 mmol/L (ref 98–111)
Creatinine, Ser: 1.92 mg/dL — ABNORMAL HIGH (ref 0.44–1.00)
GFR, Estimated: 26 mL/min — ABNORMAL LOW (ref >60)
Glucose, Bld: 97 mg/dL (ref 70–99)
Potassium: 4.2 mmol/L (ref 3.5–5.1)
Sodium: 136 mmol/L (ref 135–145)

## 2023-05-26 LAB — HEPARIN LEVEL (UNFRACTIONATED): Heparin Unfractionated: 0.19 IU/mL — ABNORMAL LOW (ref 0.30–0.70)

## 2023-05-26 MED ORDER — FELODIPINE ER 5 MG PO TB24
5.0000 mg | ORAL_TABLET | Freq: Every day | ORAL | Status: DC
  Administered 2023-05-26: 5 mg via ORAL
  Filled 2023-05-26: qty 1

## 2023-05-26 NOTE — Discharge Instructions (Addendum)
Activity Rest today, increase activity slowly. Continue taking 75 mg Plavix daily. Continue taking Aspirin 81 mg once daily. No bending, stooping, or lifting more than 10 pounds for 2 weeks. No heavy coughing while turning head for 2 weeks. No driving self for 2 weeks. Stay hydrated by drinking plenty of water.

## 2023-05-26 NOTE — Evaluation (Signed)
Physical Therapy Evaluation and Discharge Patient Details Name: Maria Cunningham MRN: [id number] DOB: [birth year] Today's Date: 05/26/2023  History of Present Illness  Pt is a 82 y.o. 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 Significant PMH: dementia, gout, DM, HTN, CVA.  Clinical Impression  PTA, pt lives with her daughter, has 24/7 supervision and assist from family members, and uses a cane intermittently for ambulation. Pt appears to be close to her functional baseline. A&O to self and month. Able to correctly guess city was "[place]," with cueing from granddaughter. Pt ambulating 250 ft with no assistive device at a min guard assist level. Also performed toileting and peri care without assist from PT. No further acute or follow up PT needs recommended. Thank you for this consult.       Assistance Recommended at Discharge PRN  If plan is discharge home, recommend the following:  Can travel by private vehicle  A little help with walking and/or transfers;A little help with bathing/dressing/bathroom;Direct supervision/assist for medications management;Direct supervision/assist for financial management;Assist for transportation;Help with stairs or ramp for entrance        Equipment Recommendations None recommended by PT  Recommendations for Other Services       Functional Status Assessment Patient has not had a recent decline in their functional status     Precautions / Restrictions Precautions Precautions: Fall Restrictions Weight Bearing Restrictions: No      Mobility  Bed Mobility Overal bed mobility: Modified Independent             General bed mobility comments: Increased time    Transfers Overall transfer level: Needs assistance Equipment used: None Transfers: Sit to/from Stand Sit to Stand: Supervision                 Ambulation/Gait Ambulation/Gait assistance: Min guard Gait Distance (Feet): 250 Feet Assistive device: None Gait Pattern/deviations: Step-through pattern, Decreased stride length Gait velocity: decreased     General Gait Details: Slower pace, increased L foot external rotation, slightly antalgic with increased distance. Min guard for safety, mild increase in lateral sway  Stairs            Wheelchair Mobility     Tilt Bed    Modified Rankin (Stroke Patients Only)       Balance Overall balance assessment: Mild deficits observed, not formally tested                                           Pertinent Vitals/Pain Pain Assessment Pain Assessment: No/denies pain    Home Living Family/patient expects to be discharged to:: Private residence Living Arrangements: Children (daughter) Available Help at Discharge: Family;Available 24 hours/day Type of Home: House Home Access: Stairs to enter Entrance Stairs-Rails: None Entrance Stairs-Number of Steps: 1   Home Layout: One level Home Equipment: Cane - single point;Grab bars - tub/shower      Prior Function Prior Level of Function : Needs assist             Mobility Comments: uses SPC intermittently; reports 3-4 falls within past 3 months ADLs Comments: help for pill box set-up, does not drive; can warm up food but does not cook; does clean; dresses and bathes herself     Hand Dominance   Dominant Hand: Right  Extremity/Trunk Assessment   Upper Extremity Assessment Upper Extremity Assessment: RUE deficits/detail;LUE deficits/detail RUE Deficits / Details: Strength 5/5 LUE Deficits / Details: Strength 5/5    Lower Extremity Assessment Lower Extremity Assessment: RLE deficits/detail;LLE deficits/detail RLE Deficits / Details: Strength 5/5 LLE Deficits / Details: Strength 5/5    Cervical / Trunk Assessment Cervical / Trunk Assessment: Normal  Communication   Communication: No  difficulties  Cognition Arousal/Alertness: Awake/alert Behavior During Therapy: WFL for tasks assessed/performed Overall Cognitive Status: History of cognitive impairments - at baseline                                 General Comments: Hx dementia. Pt A&O to self and month. Reports year is "9 or 25."        General Comments      Exercises     Assessment/Plan    PT Assessment Patient does not need any further PT services  PT Problem List         PT Treatment Interventions      PT Goals (Current goals can be found in the Care Plan section)  Acute Rehab PT Goals Patient Stated Goal: did not state PT Goal Formulation: With patient Time For Goal Achievement: 06/09/23 Potential to Achieve Goals: Good    Frequency       Co-evaluation               AM-PAC PT "6 Clicks" Mobility  Outcome Measure Help needed turning from your back to your side while in a flat bed without using bedrails?: None Help needed moving from lying on your back to sitting on the side of a flat bed without using bedrails?: None Help needed moving to and from a bed to a chair (including a wheelchair)?: A Little Help needed standing up from a chair using your arms (e.g., wheelchair or bedside chair)?: A Little Help needed to walk in hospital room?: A Little Help needed climbing 3-5 steps with a railing? : A Little 6 Click Score: 20    End of Session Equipment Utilized During Treatment: Gait belt Activity Tolerance: Patient tolerated treatment well Patient left: in chair;with call bell/phone within reach;with chair alarm set Nurse Communication: Mobility status;Other (comment) (vaginal bleeding) PT Visit Diagnosis: Unsteadiness on feet (R26.81)    Time: 4098-1191 PT Time Calculation (min) (ACUTE ONLY): 25 min   Charges:   PT Evaluation $PT Eval Moderate Complexity: 1 Mod PT Treatments $Therapeutic Activity: 8-22 mins PT General Charges $$ ACUTE PT VISIT: 1 Visit          Lillia Pauls, PT, DPT Acute Rehabilitation Services Office [phone]   Norval Morton 05/26/2023, 11:00 AM

## 2023-05-26 NOTE — Progress Notes (Addendum)
Pharmacy contacted about a small amount of vaginal bleeding (30 ml approx) . Pharmacist recommends drawing heparin level  at this time and we will continue to monitor and notify MD if bleeding persists.  Heparin stopped now per Dr Corliss Skains  at [phone]

## 2023-05-26 NOTE — Discharge Summary (Signed)
Patient ID: Maria Cunningham MRN: [id number] DOB/AGE: [birth year] 82 y.o.  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 female inpatient. History of dementia, gout, DM, HTN, CVA .Admitted 01/29/23 with complaints of dysarthria, slurred speech and dizziness. 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 Status post successful balloon angioplasty of high-grade left ICA cavernous segment with a residual 40% stenosis. Placement of a 3.5 mm x 14 mm pipeline vantage flow diverter device across the wide neck left ICA terminal aneurysm with stasis by Dr. Fatima Sanger on 7.15.24. Procedure was performed via a right CFA approach.  The patient tolerated this well.  She was admitted overnight for observation and ongoing management post-procedure.  Patient was noted to have some vaginal oozing overnight that resolved with discontinuation of the heparin gtt. Otherwise overnight stay was uneventful. She followed appropriate recommendations for best rest without issue. She tolerated breakfast and was able to mobilize around the room. She has been able to void on her own. Maria Cunningham  was assessed at bedside this AM alongside Dr. Julieanne Cotton. Patient's son is also at bedside. She denies any complaints. Her neuro exam in stable compared to admission with stable residual deficits from prior stroke in September. Right CFA groin access site is soft with no active bleeding and no appreciable pseudoaneurysm. Dressing is C/D/I. Labs are stable this AM with the showing a slight improvement in renal function.   Denies pain, headache dizziness; tingling, numbness nausea or vomiting. She will be  seen in follow-up with Dr. Corliss Skains.  They are aware they will hear from scheduler with date and time of appointment. Patient given discharge instructions.   Consults: None  Significant Diagnostic Studies: No results found.  Treatments: anticoagulation: heparin and procedures: left common carotid arteriogram  with intervention  Discharge Exam: Blood pressure (!) 137/116, pulse 71, temperature 97.9 F (36.6 C), temperature source Axillary, resp. rate 19, height 5\' 5"  (1.651 m), weight 201 lb 15.1 oz (91.6 kg), SpO2 95%.  Physical Exam Vitals and nursing note reviewed.  Constitutional:      Appearance: She is well-developed.  HENT:     Head: Normocephalic and atraumatic.  Eyes:     Conjunctiva/sclera: Conjunctivae normal.  Cardiovascular:     Rate and Rhythm: Normal rate and regular rhythm.  Pulmonary:     Effort: Pulmonary effort is normal.  Musculoskeletal:        General: Normal range of motion.     Cervical back: Normal range of motion.  Skin:    General: Skin is warm and dry.  Neurological:     Mental Status: She is alert and oriented to person, place, and time. Mental status is at baseline.     Comments: Alert, aware and oriented  to baseline Speech and comprehension is intact.  PERRL bilaterally No facial droop noted Tongue midline Can spontaneously move all 4 extremities. Hand grip strength equal bilaterally. Fine motor and coordination intact.  Distal pulses (DP's) palpable bilaterally with Doppler per RN Speech, cognition and language intact.  Comprehension and fluency are normal.  Judgment and insight normal  Negative pronator drift. Gait not assessed Romberg not assessed Heel to toe not assessed Distal  pulses not assessed   .   Psychiatric:        Mood and Affect: Mood normal.        Behavior: Behavior normal.     Disposition:    Allergies as of 05/26/2023       Reactions   Ezetimibe Other (See Comments)   Muscle Pain   Lovastatin Other (See  Comments)   Muscle Pain   Amlodipine Cough      Quinapril Cough        Medication List     TAKE these medications    allopurinol 100 MG tablet Commonly known as: ZYLOPRIM Take 100 mg by mouth daily.   aspirin EC 81 MG tablet Take 81 mg by mouth daily.   atorvastatin 40 MG tablet Commonly known as: LIPITOR Take 1 tablet (40 mg total) by mouth daily.   clopidogrel 75 MG tablet Commonly known as: Plavix Take 1 tablet (75 mg total) by mouth daily for 33 doses. Take 4 tablets (300 mg) on day #1. Take one tablet  (75 mg) each day afterwards.   donepezil 5 MG tablet Commonly known as: ARICEPT Take 5 mg by mouth daily.   felodipine 5 MG 24 hr tablet Commonly known as: PLENDIL Take 10 mg by mouth daily.   Fluticasone-Salmeterol 250-50 MCG/DOSE Aepb Commonly known as: ADVAIR Inhale 1 puff into the lungs in the morning and at bedtime.   gabapentin 100 MG capsule Commonly known as: NEURONTIN Take 200 mg by mouth at bedtime.   isosorbide mononitrate 30 MG 24 hr tablet Commonly known as: IMDUR Take 30 mg by mouth daily.   Synthroid 50 MCG tablet Generic drug: levothyroxine Take 50 mcg by mouth daily before breakfast.   telmisartan-hydrochlorothiazide 40-12.5 MG tablet Commonly known as: MICARDIS HCT Take 1 tablet by mouth daily.        Follow-up Information     Julieanne Cotton, MD Follow up.   Specialties: Interventional Radiology, Radiology Why: 2 week follow up. NIR scheduler will call with appoinment date and time. Please call [phone] with any questions or concerns Contact information: [address] [place] [address] [phone]         OBGYN. Schedule an appointment as soon as possible for a visit.   Why: Please schedule a follow up for vaginal bleeding.                 Electronically Signed: Alene Mires, NP 05/26/2023, 9:27 AM   I have spent Less Than 30 Minutes discharging Maria Cunningham.

## 2023-05-26 NOTE — Plan of Care (Signed)
Discharge teaching and instructions provided to patient and son. All questions answered.

## 2023-05-26 NOTE — Progress Notes (Signed)
ANTICOAGULATION CONSULT NOTE  Pharmacy Consult for heparin Indication:  Post Interventional Neuroradiology Procedure  Heparin Dosing Weight: 77.4 kg  Labs: Recent Labs    05/25/23 0640 05/26/23 0225  HGB 10.0* 9.3*  HCT 30.9* 29.2*  PLT 213 212  LABPROT 13.9  --   INR 1.1  --   HEPARINUNFRC  --  0.19*  CREATININE 2.02* 1.92*    Assessment: 81 yof with aneurysm, severe stenosis of L ICA, now s/p interventional neuroradiology procedure 7/15. Pharmacy consulted to dose heparin per post-neuro IR protocol. Heparin started in PACU at 500 units/hr. Hg 10, plt wnl. Noted renal dysfunction (appears to be near baseline). No bleed issues reported. Patient is not on anticoagulation PTA.  7/16 AM update:  Heparin level therapeutic at 0.19 Very small amount of ?vaginal blood per RN RN instructed to call MD if bleeding worsens   Goal of Therapy:  Heparin level 0.1-0.25 units/ml Monitor platelets by anticoagulation protocol: Yes   Plan:  Cont heparin 650 units/hr Heparin off at 0800 this AM per order admin instructions  Abran Duke, PharmD, BCPS Clinical Pharmacist Phone: [phone]

## 2023-05-28 ENCOUNTER — Encounter (HOSPITAL_COMMUNITY): Payer: Self-pay

## 2023-05-28 ENCOUNTER — Other Ambulatory Visit (HOSPITAL_COMMUNITY): Payer: Self-pay | Admitting: Interventional Radiology

## 2023-05-28 DIAGNOSIS — I671 Cerebral aneurysm, nonruptured: Secondary | ICD-10-CM

## 2023-05-28 HISTORY — PX: IR NEURO EACH ADD'L AFTER BASIC UNI LEFT (MS): IMG5373

## 2023-06-03 ENCOUNTER — Telehealth (HOSPITAL_COMMUNITY): Payer: Self-pay

## 2023-06-03 ENCOUNTER — Other Ambulatory Visit (HOSPITAL_COMMUNITY): Payer: Self-pay | Admitting: Interventional Radiology

## 2023-06-03 DIAGNOSIS — I671 Cerebral aneurysm, nonruptured: Secondary | ICD-10-CM

## 2023-06-03 NOTE — Telephone Encounter (Signed)
Called to schedule f/u, no answer, left vm. AB  

## 2023-06-16 NOTE — Addendum Note (Signed)
Encounter addended by: Edward Qualia on: 06/16/2023 7:22 AM  Actions taken: Imaging Exam ended

## 2023-09-03 ENCOUNTER — Telehealth (HOSPITAL_COMMUNITY): Payer: Self-pay

## 2023-09-03 NOTE — Telephone Encounter (Signed)
Called to schedule f/u, no answer, left vm. AB  

## 2023-11-11 ENCOUNTER — Emergency Department: Payer: Medicare Other

## 2023-11-11 ENCOUNTER — Other Ambulatory Visit: Payer: Self-pay

## 2023-11-11 ENCOUNTER — Emergency Department
Admission: EM | Admit: 2023-11-11 | Discharge: 2023-11-11 | Disposition: A | Discharge status: Home / Self Care | Payer: Medicare Other | Attending: Emergency Medicine | Admitting: Emergency Medicine

## 2023-11-11 DIAGNOSIS — I10 Essential (primary) hypertension: Secondary | ICD-10-CM | POA: Insufficient documentation

## 2023-11-11 DIAGNOSIS — F0390 Unspecified dementia without behavioral disturbance: Secondary | ICD-10-CM | POA: Diagnosis not present

## 2023-11-11 DIAGNOSIS — Z8673 Personal history of transient ischemic attack (TIA), and cerebral infarction without residual deficits: Secondary | ICD-10-CM | POA: Insufficient documentation

## 2023-11-11 DIAGNOSIS — R55 Syncope and collapse: Secondary | ICD-10-CM | POA: Diagnosis present

## 2023-11-11 DIAGNOSIS — R7989 Other specified abnormal findings of blood chemistry: Secondary | ICD-10-CM | POA: Diagnosis not present

## 2023-11-11 DIAGNOSIS — E119 Type 2 diabetes mellitus without complications: Secondary | ICD-10-CM | POA: Diagnosis not present

## 2023-11-11 LAB — CBC WITH DIFFERENTIAL/PLATELET
Abs Immature Granulocytes: 0.02 10*3/uL (ref 0.00–0.07)
Basophils Absolute: 0 10*3/uL (ref 0.0–0.1)
Basophils Relative: 1 %
Eosinophils Absolute: 0.9 10*3/uL — ABNORMAL HIGH (ref 0.0–0.5)
Eosinophils Relative: 11 %
HCT: 34.1 % — ABNORMAL LOW (ref 36.0–46.0)
Hemoglobin: 10.9 g/dL — ABNORMAL LOW (ref 12.0–15.0)
Immature Granulocytes: 0 %
Lymphocytes Relative: 24 %
Lymphs Abs: 1.9 10*3/uL (ref 0.7–4.0)
MCH: 29.7 pg (ref 26.0–34.0)
MCHC: 32 g/dL (ref 30.0–36.0)
MCV: 92.9 fL (ref 80.0–100.0)
Monocytes Absolute: 0.6 10*3/uL (ref 0.1–1.0)
Monocytes Relative: 7 %
Neutro Abs: 4.5 10*3/uL (ref 1.7–7.7)
Neutrophils Relative %: 57 %
Platelets: 234 10*3/uL (ref 150–400)
RBC: 3.67 MIL/uL — ABNORMAL LOW (ref 3.87–5.11)
RDW: 16.4 % — ABNORMAL HIGH (ref 11.5–15.5)
WBC: 8 10*3/uL (ref 4.0–10.5)
nRBC: 0 % (ref 0.0–0.2)

## 2023-11-11 LAB — COMPREHENSIVE METABOLIC PANEL
ALT: 16 U/L (ref 0–44)
AST: 21 U/L (ref 15–41)
Albumin: 3.9 g/dL (ref 3.5–5.0)
Alkaline Phosphatase: 57 U/L (ref 38–126)
Anion gap: 10 (ref 5–15)
BUN: 39 mg/dL — ABNORMAL HIGH (ref 8–23)
CO2: 23 mmol/L (ref 22–32)
Calcium: 9.3 mg/dL (ref 8.9–10.3)
Chloride: 106 mmol/L (ref 98–111)
Creatinine, Ser: 2.11 mg/dL — ABNORMAL HIGH (ref 0.44–1.00)
GFR, Estimated: 23 mL/min — ABNORMAL LOW (ref >60)
Glucose, Bld: 94 mg/dL (ref 70–99)
Potassium: 3.8 mmol/L (ref 3.5–5.1)
Sodium: 139 mmol/L (ref 135–145)
Total Bilirubin: 0.7 mg/dL (ref 0.0–1.2)
Total Protein: 7.8 g/dL (ref 6.5–8.1)

## 2023-11-11 LAB — TROPONIN I (HIGH SENSITIVITY)
Troponin I (High Sensitivity): 4 ng/L (ref <18)
Troponin I (High Sensitivity): 5 ng/L (ref <18)

## 2023-11-11 NOTE — ED Provider Notes (Signed)
 [facility] Provider Note    Event Date/Time   First MD Initiated Contact with Patient 11/11/23 1726     (approximate)   History   Loss of Consciousness   HPI  Maria Cunningham is a 83 y.o. female with a history of dementia, gout, diabetes, hypertension, and CVA who presents with an episode of loss of consciousness.  Per EMS, the family reported that the patient was sitting at a counter when she suddenly started to slur her speech, began drooling, leaned over, and then was lowered to the floor.  She briefly lost consciousness but did not have any seizure activity.  She is now back to baseline.  The patient states she feels fine and denies any acute complaints.   I reviewed the past medical records.  The patient was admitted to the hospitalist service at [facility] in July with dysarthria and dizziness.  She was found to have a 5 mm left posterior communicating artery aneurysm and had a balloon angioplasty.   Physical Exam   Triage Vital Signs: ED Triage Vitals  Encounter Vitals Group     BP      Systolic BP Percentile      Diastolic BP Percentile      Pulse      Resp      Temp      Temp src      SpO2      Weight      Height      Head Circumference      Peak Flow      Pain Score      Pain Loc      Pain Education      Exclude from Growth Chart     Most recent vital signs: Vitals:   11/11/23 1725 11/11/23 2115  BP: (!) 142/53   Pulse: 62 66  Resp: 16 19  Temp: 97.6 F (36.4 C)   SpO2: 94% 96%     General: Alert and oriented, no distress.  CV:  Good peripheral perfusion.  Resp:  Normal effort.  Abd:  No distention.  Other:  EOMI.  PERRLA.  No photophobia.  Normal speech.  No dysarthria.  No facial droop.  Cranial nerves III through XII grossly intact.  5/5 motor strength and intact sensation all extremities.  No ataxia.  No pronator drift.   ED Results / Procedures / Treatments   Labs (all labs ordered are listed, but only abnormal  results are displayed) Labs Reviewed  COMPREHENSIVE METABOLIC PANEL - Abnormal; Notable for the following components:      Result Value   BUN 39 (*)    Creatinine, Ser 2.11 (*)    GFR, Estimated 23 (*)    All other components within normal limits  CBC WITH DIFFERENTIAL/PLATELET - Abnormal; Notable for the following components:   RBC 3.67 (*)    Hemoglobin 10.9 (*)    HCT 34.1 (*)    RDW 16.4 (*)    Eosinophils Absolute 0.9 (*)    All other components within normal limits  URINALYSIS, ROUTINE W REFLEX MICROSCOPIC  TROPONIN I (HIGH SENSITIVITY)  TROPONIN I (HIGH SENSITIVITY)     EKG  ED ECG REPORT I, Waylon Cassis, the attending physician, personally viewed and interpreted this ECG.  Date: 11/11/2023 EKG Time: 1758 Rate: 65 Rhythm: normal sinus rhythm QRS Axis: normal Intervals: normal ST/T Wave abnormalities: normal Narrative Interpretation: no evidence of acute ischemia    RADIOLOGY  CT head: I independently  viewed and interpreted the images; there is no ICH.  Radiology report indicates no acute abnormality  PROCEDURES:  Critical Care performed: No  Procedures   MEDICATIONS ORDERED IN ED: Medications - No data to display   IMPRESSION / MDM / ASSESSMENT AND PLAN / ED COURSE  I reviewed the triage vital signs and the nursing notes.  83 year old female with PMH as noted above presents with acute onset of a syncopal event preceded by slurred speech and drooling.  The patient did not fall or hit her head.  Currently, the patient is back to baseline, alert and oriented, and with a nonfocal neurologic exam and normal vital signs.  Differential diagnosis includes, but is not limited to, vasovagal syncope, cardiac dysrhythmia, dehydration, electrolyte abnormality, hypoglycemia, other metabolic cause, UTI or other infection, less likely TIA.  There is no evidence of acute CVA.  We will obtain CT head, lab workup, and reassess.  Patient's presentation is most  consistent with acute presentation with potential threat to life or bodily function.  The patient is on the cardiac monitor to evaluate for evidence of arrhythmia and/or significant heart rate changes.  ----------------------------------------- 10:57 PM on 11/11/2023 -----------------------------------------  Lab workup is unremarkable.  Troponins are negative x 2.  CBC shows stable chronic anemia.  CMP reveals elevated creatinine consistent with the patient's baseline.  CT head is negative for acute findings.  The patient went to the bathroom and did not provide a urine sample.  However, there is no clinical evidence of UTI.  She has been asymptomatic over the last 5 hours since she has been in the ED, does not have any weakness, fever, nausea, or dysuria.  I had further discussion with the patient and her granddaughter who witnessed the event.  The granddaughter states that the patient initially started to slump over and appear as if she was going to pass out.  The drooling only occurred after she had already passed out.    I considered whether the patient may benefit from inpatient admission.  Given her age and comorbidities I think it would be reasonable to admit her for observation and potentially further syncope workup.  I did offer this to the patient and family.  However the patient and multiple family members including her daughters and granddaughter would feel more comfortable with the patient going home.  The patient states that she has been feeling fine since she came to the hospital.  The family reports that she has had at least 1 episode that was very similar, also had a negative workup, and went home.  With the negative workup and lack of any recurrent symptoms over the last 5 hours I feel that this is also reasonable.  I counseled the patient and family members on the results of the workup.  I gave strict return precautions and they expressed understanding.   FINAL CLINICAL  IMPRESSION(S) / ED DIAGNOSES   Final diagnoses:  Syncope, unspecified syncope type     Rx / DC Orders   ED Discharge Orders     None        Note:  This document was prepared using Dragon voice recognition software and may include unintentional dictation errors.    Jacolyn Pae, MD 11/11/23 2306

## 2023-11-11 NOTE — ED Triage Notes (Signed)
 Pt to ED via [facility] from home for c/o syncopal episode. Pt was standing at counter with family when she leaned over and started drooling and slurring her words. Family lowered pt to floor. Stroke scale negative per EMS. Pt denies pain, denies thinners. Pt has hx of mini strokes, dementia. Oriented to self.

## 2023-11-11 NOTE — Discharge Instructions (Signed)
 Return to the ER for new, worsening, recurrent episodes of passing out, slurred speech, weakness or numbness, confusion, or any other new or worsening symptoms that are concerning.  Follow-up with the primary care doctor.

## 2023-12-10 ENCOUNTER — Other Ambulatory Visit: Payer: Self-pay | Admitting: Student

## 2023-12-10 DIAGNOSIS — Z8673 Personal history of transient ischemic attack (TIA), and cerebral infarction without residual deficits: Secondary | ICD-10-CM

## 2023-12-10 DIAGNOSIS — R55 Syncope and collapse: Secondary | ICD-10-CM

## 2023-12-15 ENCOUNTER — Ambulatory Visit
Admission: RE | Admit: 2023-12-15 | Discharge: 2023-12-15 | Disposition: A | Discharge status: Home / Self Care | Payer: Medicare Other | Source: Ambulatory Visit | Attending: Student | Admitting: Student

## 2023-12-15 DIAGNOSIS — Z8673 Personal history of transient ischemic attack (TIA), and cerebral infarction without residual deficits: Secondary | ICD-10-CM

## 2023-12-15 DIAGNOSIS — R55 Syncope and collapse: Secondary | ICD-10-CM

## 2023-12-21 ENCOUNTER — Ambulatory Visit (HOSPITAL_COMMUNITY)
Admission: RE | Admit: 2023-12-21 | Discharge: 2023-12-21 | Disposition: A | Discharge status: Home / Self Care | Payer: Medicare Other | Source: Ambulatory Visit | Attending: Interventional Radiology | Admitting: Interventional Radiology

## 2023-12-21 DIAGNOSIS — I671 Cerebral aneurysm, nonruptured: Secondary | ICD-10-CM

## 2023-12-22 HISTORY — PX: IR RADIOLOGIST EVAL & MGMT: IMG5224

## 2023-12-28 ENCOUNTER — Telehealth (HOSPITAL_COMMUNITY): Payer: Self-pay | Admitting: Student

## 2023-12-28 MED ORDER — CLOPIDOGREL BISULFATE 75 MG PO TABS: 75.0000 mg | ORAL_TABLET | Freq: Every day | ORAL | 3 refills | Status: DC

## 2023-12-28 NOTE — Telephone Encounter (Signed)
Plavix 75 mg one tablet daily dispense #90 with three refills e-prescribed to [facility] on [address] In [place].  Alwyn Ren, AGACNP-BC 12/28/2023, 2:11 PM

## 2023-12-31 ENCOUNTER — Other Ambulatory Visit (HOSPITAL_COMMUNITY): Payer: Self-pay | Admitting: Interventional Radiology

## 2023-12-31 DIAGNOSIS — I671 Cerebral aneurysm, nonruptured: Secondary | ICD-10-CM

## 2024-01-08 ENCOUNTER — Ambulatory Visit (HOSPITAL_COMMUNITY)
Admission: RE | Admit: 2024-01-08 | Discharge: 2024-01-08 | Disposition: A | Discharge status: Home / Self Care | Payer: Medicare Other | Source: Ambulatory Visit | Attending: Interventional Radiology | Admitting: Interventional Radiology

## 2024-01-08 DIAGNOSIS — I671 Cerebral aneurysm, nonruptured: Secondary | ICD-10-CM | POA: Insufficient documentation

## 2024-02-04 ENCOUNTER — Telehealth (HOSPITAL_COMMUNITY): Payer: Self-pay

## 2024-02-04 NOTE — Telephone Encounter (Signed)
Called regarding recent imaging, no answer, left vm. AB

## 2024-08-10 ENCOUNTER — Emergency Department (HOSPITAL_BASED_OUTPATIENT_CLINIC_OR_DEPARTMENT_OTHER): Admission: EM | Admit: 2024-08-10 | Discharge: 2024-08-10 | Disposition: A | Discharge status: Home / Self Care

## 2024-08-10 ENCOUNTER — Other Ambulatory Visit (HOSPITAL_BASED_OUTPATIENT_CLINIC_OR_DEPARTMENT_OTHER): Payer: Self-pay

## 2024-08-10 ENCOUNTER — Emergency Department (HOSPITAL_BASED_OUTPATIENT_CLINIC_OR_DEPARTMENT_OTHER)

## 2024-08-10 ENCOUNTER — Other Ambulatory Visit: Payer: Self-pay

## 2024-08-10 ENCOUNTER — Encounter (HOSPITAL_BASED_OUTPATIENT_CLINIC_OR_DEPARTMENT_OTHER): Payer: Self-pay | Admitting: Emergency Medicine

## 2024-08-10 DIAGNOSIS — S42391A Other fracture of shaft of right humerus, initial encounter for closed fracture: Secondary | ICD-10-CM | POA: Insufficient documentation

## 2024-08-10 DIAGNOSIS — M25511 Pain in right shoulder: Secondary | ICD-10-CM | POA: Diagnosis present

## 2024-08-10 DIAGNOSIS — Z7902 Long term (current) use of antithrombotics/antiplatelets: Secondary | ICD-10-CM | POA: Diagnosis not present

## 2024-08-10 DIAGNOSIS — F0390 Unspecified dementia without behavioral disturbance: Secondary | ICD-10-CM | POA: Insufficient documentation

## 2024-08-10 DIAGNOSIS — S42301A Unspecified fracture of shaft of humerus, right arm, initial encounter for closed fracture: Secondary | ICD-10-CM

## 2024-08-10 DIAGNOSIS — W06XXXA Fall from bed, initial encounter: Secondary | ICD-10-CM | POA: Insufficient documentation

## 2024-08-10 DIAGNOSIS — Z7982 Long term (current) use of aspirin: Secondary | ICD-10-CM | POA: Insufficient documentation

## 2024-08-10 LAB — CBC WITH DIFFERENTIAL/PLATELET
Abs Immature Granulocytes: 0.04 K/uL (ref 0.00–0.07)
Basophils Absolute: 0.1 K/uL (ref 0.0–0.1)
Basophils Relative: 1 %
Eosinophils Absolute: 2.7 K/uL — ABNORMAL HIGH (ref 0.0–0.5)
Eosinophils Relative: 26 %
HCT: 34.1 % — ABNORMAL LOW (ref 36.0–46.0)
Hemoglobin: 11 g/dL — ABNORMAL LOW (ref 12.0–15.0)
Immature Granulocytes: 0 %
Lymphocytes Relative: 19 %
Lymphs Abs: 2 K/uL (ref 0.7–4.0)
MCH: 29.7 pg (ref 26.0–34.0)
MCHC: 32.3 g/dL (ref 30.0–36.0)
MCV: 92.2 fL (ref 80.0–100.0)
Monocytes Absolute: 0.6 K/uL (ref 0.1–1.0)
Monocytes Relative: 6 %
Neutro Abs: 5.1 K/uL (ref 1.7–7.7)
Neutrophils Relative %: 48 %
Platelet Morphology: NORMAL
Platelets: 228 K/uL (ref 150–400)
RBC: 3.7 MIL/uL — ABNORMAL LOW (ref 3.87–5.11)
RDW: 15.2 % (ref 11.5–15.5)
WBC: 10.5 K/uL (ref 4.0–10.5)
nRBC: 0 % (ref 0.0–0.2)

## 2024-08-10 LAB — BASIC METABOLIC PANEL WITH GFR
Anion gap: 13 (ref 5–15)
BUN: 38 mg/dL — ABNORMAL HIGH (ref 8–23)
CO2: 22 mmol/L (ref 22–32)
Calcium: 9.7 mg/dL (ref 8.9–10.3)
Chloride: 110 mmol/L (ref 98–111)
Creatinine, Ser: 1.86 mg/dL — ABNORMAL HIGH (ref 0.44–1.00)
GFR, Estimated: 26 mL/min — ABNORMAL LOW (ref >60)
Glucose, Bld: 107 mg/dL — ABNORMAL HIGH (ref 70–99)
Potassium: 4 mmol/L (ref 3.5–5.1)
Sodium: 145 mmol/L (ref 135–145)

## 2024-08-10 MED ORDER — FENTANYL CITRATE PF 50 MCG/ML IJ SOSY
50.0000 ug | PREFILLED_SYRINGE | Freq: Once | INTRAMUSCULAR | Status: AC
  Administered 2024-08-10: 50 ug via INTRAVENOUS
  Filled 2024-08-10: qty 1

## 2024-08-10 MED ORDER — OXYCODONE-ACETAMINOPHEN 5-325 MG PO TABS
1.0000 | ORAL_TABLET | Freq: Four times a day (QID) | ORAL | 0 refills | Status: DC | PRN
  Filled 2024-08-10: qty 10, 2d supply, fill #0

## 2024-08-10 NOTE — Discharge Instructions (Addendum)
 I have prescribed you Percocet for breakthrough pain.  I would like for you to call Dr. Andrey office tomorrow to schedule an appointment.  Please keep your arm in the sling as much as you can until you follow-up with orthopedics.  You can also take 600 mg ibuprofen every 6 hours as needed for pain.  Please return to the emergency department for any worsening symptoms.

## 2024-08-10 NOTE — ED Notes (Signed)
 Assisted patient from car to wheel chair, 1 person assist, hold right shoulder.

## 2024-08-10 NOTE — ED Provider Notes (Signed)
 [place] EMERGENCY DEPARTMENT AT [facility] Provider Note   CSN: [id number] Arrival date & time: 08/10/24  9095     Patient presents with: Maria   Asenath Cunningham is a 83 y.o. female patient who presents to the emergency department today for further evaluation after a fall that occurred just prior to arrival.  Patient states she was sleeping when she went to roll over and rolled out of bed.  She landed on her right shoulder.  Complaining of right shoulder pain.  Hurts worse with any movement.  Did not hit her head or lose consciousness.  She is not on any anticoagulation. Denies any chest pain or shortness of breath.     Fall       Prior to Admission medications   Medication Sig Start Date End Date Taking? Authorizing Provider  oxyCODONE-acetaminophen  (PERCOCET/ROXICET) 5-325 MG tablet Take 1-2 tablets by mouth every 6 (six) hours as needed for severe pain (pain score 7-10). 08/10/24  Yes Wilson Sample M, PA-C  allopurinol  (ZYLOPRIM ) 100 MG tablet Take 100 mg by mouth daily. 12/04/19   [provider]  aspirin  EC 81 MG tablet Take 81 mg by mouth daily.    [provider]  atorvastatin  (LIPITOR) 40 MG tablet Take 1 tablet (40 mg total) by mouth daily. Patient not taking: Reported on 05/22/2023 01/31/23 03/02/23  Raenelle Coria, MD  clopidogrel  (PLAVIX ) 75 MG tablet Take 1 tablet (75 mg total) by mouth daily. 12/28/23   Covington, Jamie R, NP  donepezil (ARICEPT) 5 MG tablet Take 5 mg by mouth daily. 01/08/23   [provider]  felodipine  (PLENDIL ) 5 MG 24 hr tablet Take 10 mg by mouth daily. 07/30/22   [provider]  Fluticasone-Salmeterol (ADVAIR) 250-50 MCG/DOSE AEPB Inhale 1 puff into the lungs in the morning and at bedtime.    [provider]  gabapentin  (NEURONTIN ) 100 MG capsule Take 200 mg by mouth at bedtime. 07/30/22 07/30/23  [provider]  isosorbide mononitrate (IMDUR) 30 MG 24 hr tablet Take 30 mg by mouth  daily. 11/22/22   [provider]  levothyroxine  (SYNTHROID ) 50 MCG tablet Take 50 mcg by mouth daily before breakfast.    [provider]  telmisartan-hydrochlorothiazide (MICARDIS HCT) 40-12.5 MG tablet Take 1 tablet by mouth daily. 10/24/22   [provider]    Allergies: Ezetimibe, Lovastatin, Amlodipine, and Quinapril    Review of Systems  All other systems reviewed and are negative.   Updated Vital Signs BP (!) 219/79   Pulse 63   Temp 97.9 F (36.6 C)   Resp 16   Wt 88.5 kg   SpO2 96%   BMI 32.45 kg/m   Physical Exam Vitals and nursing note reviewed.  Constitutional:      General: She is not in acute distress.    Appearance: Normal appearance.  HENT:     Head: Normocephalic and atraumatic.  Eyes:     General:        Right eye: No discharge.        Left eye: No discharge.  Cardiovascular:     Comments: Regular rate and rhythm.  S1/S2 are distinct without any evidence of murmur, rubs, or gallops.  Radial pulses are 2+ bilaterally.  Dorsalis pedis pulses are 2+ bilaterally.  No evidence of pedal edema. Pulmonary:     Comments: Clear to auscultation bilaterally.  Normal effort.  No respiratory distress.  No evidence of wheezes, rales, or rhonchi heard throughout. Abdominal:  General: Abdomen is flat. Bowel sounds are normal. There is no distension.     Tenderness: There is no abdominal tenderness. There is no guarding or rebound.  Musculoskeletal:        General: Normal range of motion.     Cervical back: Neck supple.     Comments: Obvious deformity to right shoulder and distal to the right shoulder.  She is neurovascularly intact in the lower part of the arm and hand.  Good cap refill.  2+ radial pulse felt and equal.   Skin:    General: Skin is warm and dry.     Findings: No rash.  Neurological:     General: No focal deficit present.     Mental Status: She is alert.  Psychiatric:        Mood and Affect: Mood normal.         Behavior: Behavior normal.     (all labs ordered are listed, but only abnormal results are displayed) Labs Reviewed  CBC WITH DIFFERENTIAL/PLATELET - Abnormal; Notable for the following components:      Result Value   RBC 3.70 (*)    Hemoglobin 11.0 (*)    HCT 34.1 (*)    Eosinophils Absolute 2.7 (*)    All other components within normal limits  BASIC METABOLIC PANEL WITH GFR - Abnormal; Notable for the following components:   Glucose, Bld 107 (*)    BUN 38 (*)    Creatinine, Ser 1.86 (*)    GFR, Estimated 26 (*)    All other components within normal limits  PATHOLOGIST SMEAR REVIEW    EKG: None  Radiology: DG Elbow 2 Views Right Result Date: 08/10/2024 CLINICAL DATA:  Possible abnormal alignment of the right elbow on right humerus radiographs obtained earlier today. The patient fell and has a right mid shaft humerus fracture. EXAM: RIGHT ELBOW - 2 VIEW COMPARISON:  Right humerus radiographs obtained earlier today. FINDINGS: The lateral view was obtained with the elbow in extension due to patient limitations with the right humerus fracture. Therefore, the presence or absence of an effusion can not be assessed. The bones are in normal position and alignment with no fracture or dislocation seen. Olecranon enthesophyte formation and medial and lateral epicondyle hyperostosis are noted. IMPRESSION: 1. No fracture or dislocation. 2. The presence or absence of an effusion can not be assessed due to the lateral view being obtained with the elbow in extension. Electronically Signed   By: Elspeth Bathe M.D.   On: 08/10/2024 11:23   DG Shoulder Right Port Result Date: 08/10/2024 CLINICAL DATA:  Fall with right arm injury. EXAM: RIGHT SHOULDER - 1 VIEW COMPARISON:  None Available. FINDINGS: Only a single view was obtained demonstrating a midshaft right humeral fracture with roughly half shaft displacement. Based on thickening of the lateral cortex this fracture may be either old or pathologic.  Alignment of the visualized elbow may be abnormal and right elbow injury is not excluded. IMPRESSION: 1. Midshaft right humeral fracture with roughly half shaft displacement. Based on thickening of the lateral cortex this fracture may be either old or pathologic. Recommend additional views and/or CT evaluation. 2. Possible abnormal alignment of the right elbow. Recommend dedicated right elbow films. Electronically Signed   By: Marcey Moan M.D.   On: 08/10/2024 10:05     Procedures   Medications Ordered in the ED  fentaNYL  (SUBLIMAZE ) injection 50 mcg (50 mcg Intravenous Given 08/10/24 0941)     Medical Decision Making Maria Cunningham is a 83 y.o. female patient who presents to the emergency department today for further evaluation of right shoulder pain.  Obvious deformity to the distal shoulder.  Questionable whether or not this is a dislocation or fracture or combination.  Will plan to give the patient some fentanyl  she does have significant pain with minimal range of motion.  She is neurovasc intact.  Will plan to get an x-ray and some basic labs as well.  Patient has a positive humeral shaft fracture on the right.  I will give her some narcotic pain medication as breakthrough pain management in addition to NSAIDs over-the-counter. Sling provided. I will have her follow-up with Dr. Beuford with orthopedics as he is on-call today.  Strict turn precautions were discussed.  She is safe for discharge at this time.  Amount and/or Complexity of Data Reviewed Labs: ordered. Radiology: ordered.  Risk Prescription drug management.     Final diagnoses:  Closed fracture of shaft of right humerus, unspecified fracture morphology, initial encounter    ED Discharge Orders          Ordered    oxyCODONE-acetaminophen  (PERCOCET/ROXICET) 5-325 MG tablet  Every 6 hours PRN        08/10/24 1246               Theotis Cameron HERO, PA-C 08/10/24 1252    Gennaro Bouchard L, DO 08/17/24  [phone]

## 2024-08-10 NOTE — ED Triage Notes (Signed)
 Clemens this morning , left shoulder pain , unable to move left arm . Possible dislocation . No head or neck pain . Hx dementia .

## 2024-08-11 LAB — PATHOLOGIST SMEAR REVIEW

## 2024-08-18 ENCOUNTER — Emergency Department (HOSPITAL_COMMUNITY): Admission: EM | Admit: 2024-08-18 | Discharge: 2024-08-18 | Disposition: A | Discharge status: Home / Self Care

## 2024-08-18 ENCOUNTER — Emergency Department (HOSPITAL_COMMUNITY)

## 2024-08-18 ENCOUNTER — Encounter (HOSPITAL_COMMUNITY): Payer: Self-pay

## 2024-08-18 DIAGNOSIS — F0390 Unspecified dementia without behavioral disturbance: Secondary | ICD-10-CM | POA: Diagnosis not present

## 2024-08-18 DIAGNOSIS — Y92003 Bedroom of unspecified non-institutional (private) residence as the place of occurrence of the external cause: Secondary | ICD-10-CM | POA: Insufficient documentation

## 2024-08-18 DIAGNOSIS — I10 Essential (primary) hypertension: Secondary | ICD-10-CM | POA: Insufficient documentation

## 2024-08-18 DIAGNOSIS — S4991XA Unspecified injury of right shoulder and upper arm, initial encounter: Secondary | ICD-10-CM | POA: Diagnosis present

## 2024-08-18 DIAGNOSIS — W06XXXA Fall from bed, initial encounter: Secondary | ICD-10-CM | POA: Diagnosis not present

## 2024-08-18 DIAGNOSIS — S42391A Other fracture of shaft of right humerus, initial encounter for closed fracture: Secondary | ICD-10-CM | POA: Diagnosis not present

## 2024-08-18 DIAGNOSIS — Z7902 Long term (current) use of antithrombotics/antiplatelets: Secondary | ICD-10-CM | POA: Insufficient documentation

## 2024-08-18 DIAGNOSIS — W19XXXA Unspecified fall, initial encounter: Secondary | ICD-10-CM

## 2024-08-18 LAB — CBC WITH DIFFERENTIAL/PLATELET
Abs Immature Granulocytes: 0.02 K/uL (ref 0.00–0.07)
Basophils Absolute: 0.1 K/uL (ref 0.0–0.1)
Basophils Relative: 1 %
Eosinophils Absolute: 1.5 K/uL — ABNORMAL HIGH (ref 0.0–0.5)
Eosinophils Relative: 16 %
HCT: 34.4 % — ABNORMAL LOW (ref 36.0–46.0)
Hemoglobin: 10.8 g/dL — ABNORMAL LOW (ref 12.0–15.0)
Immature Granulocytes: 0 %
Lymphocytes Relative: 20 %
Lymphs Abs: 1.9 K/uL (ref 0.7–4.0)
MCH: 29.6 pg (ref 26.0–34.0)
MCHC: 31.4 g/dL (ref 30.0–36.0)
MCV: 94.2 fL (ref 80.0–100.0)
Monocytes Absolute: 0.7 K/uL (ref 0.1–1.0)
Monocytes Relative: 7 %
Neutro Abs: 5.1 K/uL (ref 1.7–7.7)
Neutrophils Relative %: 56 %
Platelets: 261 K/uL (ref 150–400)
RBC: 3.65 MIL/uL — ABNORMAL LOW (ref 3.87–5.11)
RDW: 15.1 % (ref 11.5–15.5)
WBC: 9.2 K/uL (ref 4.0–10.5)
nRBC: 0 % (ref 0.0–0.2)

## 2024-08-18 LAB — URINALYSIS, ROUTINE W REFLEX MICROSCOPIC
Bilirubin Urine: NEGATIVE
Glucose, UA: NEGATIVE mg/dL
Hgb urine dipstick: NEGATIVE
Ketones, ur: NEGATIVE mg/dL
Leukocytes,Ua: NEGATIVE
Nitrite: NEGATIVE
Protein, ur: NEGATIVE mg/dL
Specific Gravity, Urine: 1.009 (ref 1.005–1.030)
pH: 5 (ref 5.0–8.0)

## 2024-08-18 LAB — COMPREHENSIVE METABOLIC PANEL WITH GFR
ALT: 14 U/L (ref 0–44)
AST: 19 U/L (ref 15–41)
Albumin: 3.4 g/dL — ABNORMAL LOW (ref 3.5–5.0)
Alkaline Phosphatase: 46 U/L (ref 38–126)
Anion gap: 12 (ref 5–15)
BUN: 43 mg/dL — ABNORMAL HIGH (ref 8–23)
CO2: 21 mmol/L — ABNORMAL LOW (ref 22–32)
Calcium: 9.2 mg/dL (ref 8.9–10.3)
Chloride: 105 mmol/L (ref 98–111)
Creatinine, Ser: 1.94 mg/dL — ABNORMAL HIGH (ref 0.44–1.00)
GFR, Estimated: 25 mL/min — ABNORMAL LOW (ref >60)
Glucose, Bld: 119 mg/dL — ABNORMAL HIGH (ref 70–99)
Potassium: 4 mmol/L (ref 3.5–5.1)
Sodium: 138 mmol/L (ref 135–145)
Total Bilirubin: 0.6 mg/dL (ref 0.0–1.2)
Total Protein: 6.7 g/dL (ref 6.5–8.1)

## 2024-08-18 LAB — LIPASE, BLOOD: Lipase: 50 U/L (ref 11–51)

## 2024-08-18 MED ORDER — HYDROCHLOROTHIAZIDE 12.5 MG PO TABS
12.5000 mg | ORAL_TABLET | Freq: Every day | ORAL | Status: DC
Start: 2024-08-18 — End: 2024-08-18
  Administered 2024-08-18: 12.5 mg via ORAL
  Filled 2024-08-18: qty 1

## 2024-08-18 MED ORDER — IRBESARTAN 150 MG PO TABS
150.0000 mg | ORAL_TABLET | Freq: Every day | ORAL | Status: DC
  Administered 2024-08-18: 150 mg via ORAL
  Filled 2024-08-18: qty 1

## 2024-08-18 NOTE — ED Provider Notes (Signed)
 [place] EMERGENCY DEPARTMENT AT [facility] Provider Note   CSN: [id number] Arrival date & time: 08/18/24  9282     Patient presents with: Felton   Maria Cunningham is a 83 y.o. female.    Fall Pertinent negatives include no chest pain, no abdominal pain and no shortness of breath.    Patient presents because of fall.  According to family, patient fell out of bed again.  Lives at home with daughter as well as granddaughter.  Patient has a camera in her room as well as rails on the side of her bed.  They usually have a bed alarm on and they forgot to turn this on last night after patient had a urinate.  Patient was found on the floor this morning after a fall.  Does not take anticoagulation.  Acting at baseline per family.  Usually only alert to self but not time or place.  Has not been have any kind of fevers here recently.  No chest pain or shortness of breath.  No nausea vomit diarrhea.  Bowels been regular.  Patient did not take any of her medication including her antihypertensive this morning.   Previous medical history reviewed : Patient was seen on August 10, 2024 because of fall.  Clemens out of bed.  Positive humerus fracture on the right.  Instructed to follow-up with orthopedics.      Prior to Admission medications   Medication Sig Start Date End Date Taking? Authorizing Provider  allopurinol  (ZYLOPRIM ) 100 MG tablet Take 100 mg by mouth daily. 12/04/19   [provider]  aspirin  EC 81 MG tablet Take 81 mg by mouth daily.    [provider]  atorvastatin  (LIPITOR) 40 MG tablet Take 1 tablet (40 mg total) by mouth daily. Patient not taking: Reported on 05/22/2023 01/31/23 03/02/23  Raenelle Coria, MD  clopidogrel  (PLAVIX ) 75 MG tablet Take 1 tablet (75 mg total) by mouth daily. 12/28/23   Covington, Jamie R, NP  donepezil (ARICEPT) 5 MG tablet Take 5 mg by mouth daily. 01/08/23   [provider]  felodipine  (PLENDIL ) 5 MG 24 hr tablet Take 10 mg  by mouth daily. 07/30/22   [provider]  Fluticasone-Salmeterol (ADVAIR) 250-50 MCG/DOSE AEPB Inhale 1 puff into the lungs in the morning and at bedtime.    [provider]  gabapentin  (NEURONTIN ) 100 MG capsule Take 200 mg by mouth at bedtime. 07/30/22 07/30/23  [provider]  isosorbide mononitrate (IMDUR) 30 MG 24 hr tablet Take 30 mg by mouth daily. 11/22/22   [provider]  levothyroxine  (SYNTHROID ) 50 MCG tablet Take 50 mcg by mouth daily before breakfast.    [provider]  oxyCODONE-acetaminophen  (PERCOCET/ROXICET) 5-325 MG tablet Take 1-2 tablets by mouth every 6 (six) hours as needed for severe pain (pain score 7-10). 08/10/24   Theotis Peers M, PA-C  telmisartan-hydrochlorothiazide (MICARDIS HCT) 40-12.5 MG tablet Take 1 tablet by mouth daily. 10/24/22   [provider]    Allergies: Ezetimibe, Lovastatin, Amlodipine, and Quinapril    Review of Systems  Constitutional:  Negative for chills and fever.  HENT:  Negative for ear pain and sore throat.   Eyes:  Negative for pain and visual disturbance.  Respiratory:  Negative for cough and shortness of breath.   Cardiovascular:  Negative for chest pain and palpitations.  Gastrointestinal:  Negative for abdominal pain and vomiting.  Genitourinary:  Negative for dysuria and hematuria.  Musculoskeletal:  Negative for arthralgias and back  pain.  Skin:  Negative for color change and rash.  Neurological:  Negative for seizures and syncope.  All other systems reviewed and are negative.   Updated Vital Signs BP (!) 181/78   Pulse 72   Temp 97.6 F (36.4 C) (Oral)   Resp 16   Ht 5' 6 (1.676 m)   Wt 88.5 kg   SpO2 100%   BMI 31.49 kg/m   Physical Exam Vitals and nursing note reviewed.  Constitutional:      General: She is not in acute distress.    Appearance: She is well-developed.  HENT:     Head: Normocephalic and atraumatic.  Eyes:     Conjunctiva/sclera:  Conjunctivae normal.  Cardiovascular:     Rate and Rhythm: Normal rate and regular rhythm.     Heart sounds: No murmur heard. Pulmonary:     Effort: Pulmonary effort is normal. No respiratory distress.     Breath sounds: Normal breath sounds.  Abdominal:     Palpations: Abdomen is soft.     Tenderness: There is no abdominal tenderness.  Musculoskeletal:        General: No swelling.       Arms:     Cervical back: Neck supple.  Skin:    General: Skin is warm and dry.     Capillary Refill: Capillary refill takes less than 2 seconds.  Neurological:     Mental Status: She is alert. Mental status is at baseline.  Psychiatric:        Mood and Affect: Mood normal.     (all labs ordered are listed, but only abnormal results are displayed) Labs Reviewed  CBC WITH DIFFERENTIAL/PLATELET - Abnormal; Notable for the following components:      Result Value   RBC 3.65 (*)    Hemoglobin 10.8 (*)    HCT 34.4 (*)    Eosinophils Absolute 1.5 (*)    All other components within normal limits  COMPREHENSIVE METABOLIC PANEL WITH GFR - Abnormal; Notable for the following components:   CO2 21 (*)    Glucose, Bld 119 (*)    BUN 43 (*)    Creatinine, Ser 1.94 (*)    Albumin 3.4 (*)    GFR, Estimated 25 (*)    All other components within normal limits  URINALYSIS, ROUTINE W REFLEX MICROSCOPIC  LIPASE, BLOOD    EKG: EKG Interpretation Date/Time:  Thursday August 18 2024 07:25:55 EDT Ventricular Rate:  73 PR Interval:  210 QRS Duration:  81 QT Interval:  389 QTC Calculation: 429 R Axis:   21  Text Interpretation: Sinus rhythm Confirmed by Simon Rea [phone]) on 08/18/2024 7:50:58 AM  Radiology: ARCOLA Humerus Right Result Date: 08/18/2024 EXAM: 1 VIEW(S) XRAY OF THE RIGHT HUMERUS 08/18/2024 08:11:52 AM COMPARISON: Right shoulder and elbow series 08/10/2024. CLINICAL HISTORY: 83 year old female. Fall. Known right humerus fracture. Evaluation for worsening fracture pathology. FINDINGS: BONES  AND JOINTS: Mid shaft right humerus fracture is nonacute, was present on 08/11/2024, but demonstrates increased distraction, increased 1/2 shaft width lateral displacement and mildly increased angulation. Alignment appears maintained at both the right shoulder and elbow. SOFT TISSUES: Associated right arm soft tissue swelling. IMPRESSION: 1. Sub-acute mid shaft right humerus fracture with increased distraction, lateral displacement, and angulation since 08/10/2024. Electronically signed by: Helayne Hurst MD 08/18/2024 08:32 AM EDT RP Workstation: [id number]   CT Cervical Spine Wo Contrast Result Date: 08/18/2024 EXAM: CT CERVICAL SPINE WITHOUT CONTRAST 08/18/2024 07:59:43 AM TECHNIQUE: CT of the cervical  spine was performed without the administration of intravenous contrast. Multiplanar reformatted images are provided for review. Automated exposure control, iterative reconstruction, and/or weight based adjustment of the mA/kV was utilized to reduce the radiation dose to as low as reasonably achievable. COMPARISON: Cervical spine CT 02/28/2020. CLINICAL HISTORY: 83 year old female with unwitnessed fall, right humerus fracture, and history of Plavix  non-compliance. FINDINGS: CERVICAL SPINE: BONES AND ALIGNMENT: Chronic straightening of cervical lordosis, less reversal compared to prior exam. Normal bone mineralization for age. No acute fracture or traumatic malalignment. Mid shaft right humerus fracture is visible on the scout view. This appears displaced and angulated. 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 SOFT TISSUES: Negative visible noncontrast neck soft tissues. Negative visible noncontrast thoracic inlet. IMPRESSION: 1. No acute cervical spine injury. 2. Mid-shaft right humerus fracture identified on scout view. 3. 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 Electronically signed by: Helayne Hurst MD 08/18/2024 08:30 AM EDT RP Workstation: [id number]   CT Head Wo Contrast Result Date: 08/18/2024 EXAM: CT HEAD WITHOUT CONTRAST 08/18/2024 07:59:43 AM TECHNIQUE: CT of the head was performed without the administration of intravenous contrast. Automated exposure control, iterative reconstruction, and/or weight based adjustment of the mA/kV was utilized to reduce the radiation dose to as low as reasonably achievable. COMPARISON: Head CT 11/11/2023 and brain MRI 12/15/2023. CLINICAL HISTORY: 83 year old female with unwitnessed fall, trauma, and recent right humerus fracture. Prescribed Plavix  but not taking for 1 year. FINDINGS: BRAIN AND VENTRICLES: No acute hemorrhage. No evidence of acute infarct. No hydrocephalus. No extra-axial collection. No mass effect or midline shift. Stable cerebral volume. Chronic encephalomalacia in the left parietal lobe and along the left parietooccipital sulcus. Stable gray white differentiation with patchy, moderate additional mostly posterior hemispheric cerebral white matter hypodensity. Chronic basal ganglia vascular calcifications. No suspicious intracranial vascular hyperdensity. A distal left ICA vascular stent is redemonstrated, superimposed on skull base atherosclerosis. ORBITS: No acute abnormality. SINUSES: No acute abnormality. SOFT TISSUES AND SKULL: No acute soft tissue abnormality. No skull fracture. IMPRESSION: 1. No acute traumatic injury identified 2. Stable left posterior hemisphere chronic encephalomalacia, chronic white matter disease. 3. LICA vascular stent. Electronically signed by: Helayne Hurst MD 08/18/2024 08:05 AM EDT RP Workstation: [id number]     Procedures   Medications Ordered in the ED  irbesartan  (AVAPRO ) tablet 150 mg (150 mg Oral Given 08/18/24 0831)    And   hydrochlorothiazide (HYDRODIURIL) tablet 12.5 mg (12.5 mg Oral Given 08/18/24 0831)                                    Medical Decision Making Amount and/or Complexity of Data Reviewed Labs: ordered. Radiology: ordered.  Risk Prescription drug management.     HPI:  Patient presents because of fall.  According to family, patient fell out of bed again.  Lives at home with daughter as well as granddaughter.  Patient has a camera in her room as well as rails on the side of her bed.  They usually have a bed alarm on and they forgot to turn this on last night after patient had a urinate.  Patient was found on the floor this morning after a fall.  Does not take anticoagulation.  Acting at baseline per family.  Usually only alert to self but not time or place.  Has not been have any kind of fevers here recently.  No chest pain or shortness of breath.  No nausea vomit diarrhea.  Bowels been regular.  Patient did not take any of her medication including her antihypertensive this morning.   Previous medical history reviewed : Patient was seen on August 10, 2024 because of fall.  Clemens out of bed.  Positive humerus fracture on the right.  Instructed to follow-up with orthopedics.      MDM:   Upon exam, patient hypertensive but otherwise normal vs.  Alert to self but not time or place.  No focal deficits.  Seems to be more generalized fusion in setting of her dementia.  No focal deficits.  No concerns for CVA.  As above, previous history of right humerus fracture.  Will reimage this fracture in the setting of fall to see if there is any kind of change in fracture morphology.  As neurovascularly intact distal to the injury site.  Will also obtain CT head and CT cervical spine to rule out any kind of intracranial pathology such as subdural epidural or any kind of cervical fracture given advanced age.  Also obtain basic laboratory workup and UA.  In terms of patient's hypertension.  At mental  baseline.  Did dose the patient with home dose antihypertensive here this morning.  Will reevaluate.    CT scan did not show evidence of subdural epidural.  No obvious intracranial bleed.  No new acute pathology was seen.  CT cervical spine shows some chronic degenerative changes but no acute fracture.  Remains neurologically intact.  No concern for any kind of central cord syndrome or other central pathology that needs MRI imaging at this time.  Did obtain repeat imaging the right humerus.  Wanted to see if there is any kind of worsening fracture morphology.  Possible slight increase in fracture morphology.  Can continue to follow-up with orthopedic surgery.  Once again, neurovascular intact distal to site.  No concern for any kind of radial or medial nerve injury at this time.  Patient has equal grip strength bilaterally.  Able to move all of her fingers.  Explained to family that they will need to keep a close eye on this if she starts complain about encounter numbness or tingling and she is can back to ED for further evaluation.  2+ radial pulses.  Did obtain laboratory workup as well.  Wanted to rule out count of large electrolyte derangements.  Sound like more of a mechanical fall but wanted to rule out any kind of large electrolyte derangements to explain other etiology of fall since it was unwitnessed.  Labs at baseline.  Hemoglobin 10.8 which is around her baseline.  Creatinine 1.94 which is around her baseline as well.  Potassium and sodium normal.   UA as well.  Unremarkable for UTI.  Patient was hypertensive when she first arrived here.  Systolic in the 220s to 230s.  Did not take her medication today.  Subsequently did dose her with her medication here in the ED.  Systolic improved to 180s.  Remained at neurobaseline.  Discussed with the family that they need to take her blood pressure once to twice daily and write her down a piece of paper and take these results to her PCP for further  blood pressure medication titration.   Upon reexamining the patient.  Patient still is remaining at neurobaseline.  No acute distress.  Family  will take patient home.  Interventions: hydrochlorothiazide and irbesartan    EKG Interpreted by Me: SINus   Cardiac Tele Interpreted by Me: Sinus rhythm no arrhythmia    I have independently interpreted the CXR  and CT  images and agree with the radiologist finding   Social Determinant of Health: Dementia    Disposition and Follow Up: To follow up with orthopedics       Final diagnoses:  Fall, initial encounter  Primary hypertension  Other closed fracture of shaft of right humerus, initial encounter    ED Discharge Orders     None          Simon Lavonia SAILOR, MD 08/18/24 1006

## 2024-08-18 NOTE — ED Triage Notes (Addendum)
 BIB EMS from home r/t unwitnessed fall near her bed this morning. Prescribed plavix  but family report patient has not been taking for 1year. Recently Right humerus fracture. Splint on. EMS administered 100mcg of fentanyl . A&Ox2. At cognitive baseline.

## 2024-08-18 NOTE — Discharge Instructions (Signed)
 In terms of the arm fracture.  Please continue to follow-up with orthopedic surgery.  It may look a little bit worse angulation.  If she ever complains about increasing pain this arm or any, numbness or tingling in the right hand then please come back to the ED.   Patient was hypertensive when she first arrived.  She did receive her blood pressure medication here.  Do not give this again to her today.  Please take your blood pressure once or twice daily and write down a piece of paper and follow-up with PCP.  You are doing a great job taking care of of your mother.

## 2024-08-19 ENCOUNTER — Emergency Department: Admission: EM | Admit: 2024-08-19 | Discharge: 2024-08-19 | Disposition: A | Discharge status: Home / Self Care

## 2024-08-19 ENCOUNTER — Emergency Department

## 2024-08-19 DIAGNOSIS — J45909 Unspecified asthma, uncomplicated: Secondary | ICD-10-CM | POA: Insufficient documentation

## 2024-08-19 DIAGNOSIS — E119 Type 2 diabetes mellitus without complications: Secondary | ICD-10-CM | POA: Diagnosis not present

## 2024-08-19 DIAGNOSIS — W06XXXD Fall from bed, subsequent encounter: Secondary | ICD-10-CM | POA: Insufficient documentation

## 2024-08-19 DIAGNOSIS — F0390 Unspecified dementia without behavioral disturbance: Secondary | ICD-10-CM | POA: Diagnosis not present

## 2024-08-19 DIAGNOSIS — S4991XD Unspecified injury of right shoulder and upper arm, subsequent encounter: Secondary | ICD-10-CM | POA: Diagnosis present

## 2024-08-19 DIAGNOSIS — S42301D Unspecified fracture of shaft of humerus, right arm, subsequent encounter for fracture with routine healing: Secondary | ICD-10-CM | POA: Diagnosis not present

## 2024-08-19 NOTE — Discharge Instructions (Signed)
 You were seen in the emergency department for hand swelling in the setting of recent humerus fracture.  Workup today was reassuring and your case was discussed with orthopedics and you are safe to return home.  Please keep your orthopedic appointment as already scheduled on the 21st.  Continue to wear your brace.  Return if any acutely worsening symptoms or any other emergency -- RETURN PRECAUTIONS & AFTERCARE: (ENGLISH) RETURN PRECAUTIONS: Return immediately to the emergency department or see/call your doctor if you feel worse, weak or have changes in speech or vision, are short of breath, have fever, vomiting, pain, bleeding or dark stool, trouble urinating or any new issues. Return here or see/call your doctor if not improving as expected for your suspected condition. FOLLOW-UP CARE: Call your doctor and/or any doctors we referred you to for more advice and to make an appointment. Do this today, tomorrow or after the weekend. Some doctors only take PPO insurance so if you have HMO insurance you may want to contact your HMO or your regular doctor for referral to a specialist within your plan. Either way tell the doctor's office that it was a referral from the emergency department so you get the soonest possible appointment.  YOUR TEST RESULTS: Take result reports of any blood or urine tests, imaging tests and EKG's to your doctor and any referral doctor. Have any abnormal tests repeated. Your doctor or a referral doctor can let you know when this should be done. Also make sure your doctor contacts this hospital to get any test results that are not currently available such as cultures or special tests for infection and final imaging reports, which are often not available at the time you leave the ER but which may list additional important findings that are not documented on the preliminary report. BLOOD PRESSURE: If your blood pressure was greater than 120/80 have your blood pressure rechecked within 1 to 2  weeks. MEDICATION SIDE EFFECTS: Do not drive, walk, bike, take the bus, etc. if you have received or are being prescribed any sedating medications such as those for pain or anxiety or certain antihistamines like Benadryl. If you have been give one of these here get a taxi home or have a friend drive you home. Ask your pharmacist to counsel you on potential side effects of any new medication

## 2024-08-19 NOTE — ED Triage Notes (Addendum)
 Last Wednesday had a fall and fractured R arm. Today R hand is swollen and cold per daughter. Patient has dementia and daughter states she dangles arm down at times and wont keep it in the sling. R Hand/ arm is warm to touch in triage. PCP told them to come to have the arm evaluated. Was seen at [facility] ED yesterday for a fall on the R arm.

## 2024-08-20 NOTE — ED Provider Notes (Signed)
 [facility] Provider Note    Event Date/Time   First MD Initiated Contact with Patient 08/19/24 1717     (approximate)   History   Arm Swelling   HPI  Maria Cunningham is a 83 y.o. female with dementia, type 2 diabetes, hyperlipidemia asthma who presents with 24 hours of progressively worsening right hand swelling.  Patient presents with her grandchild who gives most of the history.  Patient fell out of bed on 10/1 was seen at a different emergency department and was diagnosed with a humerus fracture.  She was placed in a sling and outpatient follow-up was arranged.  She represented to the emergency department yesterday after another fall from bed and she had slightly worsening translocation of the fracture fragments was placed in [place] splint.  Today the family has noticed that the patient's right hand is more edematous and on 1 occasion appeared cold which she will report of the return precautions and so they came to the emergency department.  Patient is at her baseline mental status.  They have since initiated bed rails and a camera in the patient's room to prevent falls.  Patient has not endorsed or complained of any other symptoms.  Patient denies any sensation changes in her upper extremity and denies any acutely worsening pain.  She has an outpatient orthopedic appointment already scheduled for 10/21      Physical Exam   Triage Vital Signs: ED Triage Vitals  Encounter Vitals Group     BP 08/19/24 1710 (!) 160/63     Girls Systolic BP Percentile --      Girls Diastolic BP Percentile --      Boys Systolic BP Percentile --      Boys Diastolic BP Percentile --      Pulse Rate 08/19/24 1710 69     Resp 08/19/24 1710 18     Temp 08/19/24 1710 98.4 F (36.9 C)     Temp Source 08/19/24 1710 Oral     SpO2 08/19/24 1710 95 %     Weight --      Height --      Head Circumference --      Peak Flow --      Pain Score 08/19/24 1707 5     Pain Loc --       Pain Education --      Exclude from Growth Chart --     Most recent vital signs: Vitals:   08/19/24 1710  BP: (!) 160/63  Pulse: 69  Resp: 18  Temp: 98.4 F (36.9 C)  SpO2: 95%    Nursing Triage Note reviewed. Vital signs reviewed and patients oxygen saturation is normoxic  General: Patient is well nourished, well developed, awake and alert, resting comfortably in no acute distress Head: Normocephalic and atraumatic Eyes: Normal inspection, extraocular muscles intact, no conjunctival pallor Ear, nose, throat: Normal external exam Neck: Normal range of motion Respiratory: Patient is in no respiratory distress, lungs CTAB Cardiovascular: Patient is not tachycardic, RRR without murmur appreciated 2+ radial pulse in the right upper extremity with less than 2-second cap refill in all digits GI: Abd SNT with no guarding or rebound  Back: Normal inspection of the back with good strength and range of motion throughout all ext Extremities: pulses intact with good cap refills, no LE pitting edema or calf tenderness Hand in the right is mildly edematous but not erythematous and is appropriately warm.  Right upper extremity immobilized in splint Neuro:  The patient is alert and oriented to person, place, not to time, appropriately conversive,with 5/5 bilat UE/LE strength, no gross motor or sensory defects noted. Coordination appears to be adequate. Skin: Warm, dry, and intact Psych: normal mood and affect, no SI or HI  ED Results / Procedures / Treatments   Labs (all labs ordered are listed, but only abnormal results are displayed) Labs Reviewed - No data to display   EKG None  RADIOLOGY X-ray of humerus: Humerus fracture with improved displacement on my independent review interpretation radiologist agrees Venous ultrasound: No DVT    PROCEDURES:  Critical Care performed: No  Procedures   MEDICATIONS ORDERED IN ED: Medications - No data to display   IMPRESSION / MDM /  ASSESSMENT AND PLAN / ED COURSE                                Differential diagnosis includes, but is not limited to, worsening upper extremity fracture, DVT  ED course: Patient is well-appearing and extremity is neurovascularly intact with 2+ radial pulse.  I do not suspect any arterial occlusion and presentation is not consistent with an ischemic limb.  Given the hand swelling which I suspect is secondary to the recurrent fall yesterday, Doppler ultrasound was ordered which demonstrated no DVT.  X-ray of the extremity actually demonstrated improved alignment of the fracture fragments but case was still discussed with our on-call orthopedist to ensure that patient was safe to follow-up in the timeline which he reported she was.  Patient is at her baseline mental status per family and there has been no reported infectious concerns.  I do not think any further workup is required today.  Return precautions given and family voiced understanding and requested discharge   Clinical Course as of 08/20/24 0020  Fri Aug 19, 2024  1910 US  Venous Img Upper Uni Right(DVT) No DVT [HD]  1937 Reaching out to orthopedics [HD]  1946 Case discussed with Dr. Ezra on-call for orthopedics.  He reviewed the x-rays from the last visits and reports improved alignment today.  He does not see any need for any closer follow-up or procedures.  She is to continue wearing this area onto her brace.  Daughter now at bedside.  Reviewed return precautions and workup today.  They will keep her orthopedic appointment scheduled on 21 October.  All questions answered and patient and daughter voiced understanding and requested discharge [HD]    Clinical Course User Index [HD] Nicholaus Rolland BRAVO, MD   --  COPA: 5 The patient has the following acute or chronic illness/injury that poses a possible threat to life or bodily function: [X] : The patient has a potentially serious acute condition or an acute exacerbation of a chronic  illness requiring urgent evaluation and management in the Emergency Department. The clinical presentation necessitates immediate consideration of life-threatening or function-threatening diagnoses, even if they are ultimately ruled out.   FINAL CLINICAL IMPRESSION(S) / ED DIAGNOSES   Final diagnoses:  Closed fracture of shaft of right humerus with routine healing, unspecified fracture morphology, subsequent encounter     Rx / DC Orders   ED Discharge Orders     None        Note:  This document was prepared using Dragon voice recognition software and may include unintentional dictation errors.   Nicholaus Rolland BRAVO, MD 08/20/24 [phone]

## 2024-09-16 ENCOUNTER — Encounter: Payer: Self-pay | Admitting: Orthopedic Surgery

## 2024-09-21 ENCOUNTER — Other Ambulatory Visit: Payer: Self-pay | Admitting: Orthopedic Surgery

## 2024-09-21 DIAGNOSIS — S42301G Unspecified fracture of shaft of humerus, right arm, subsequent encounter for fracture with delayed healing: Secondary | ICD-10-CM

## 2024-09-22 ENCOUNTER — Ambulatory Visit
Admission: RE | Admit: 2024-09-22 | Discharge: 2024-09-22 | Disposition: A | Source: Ambulatory Visit | Attending: Orthopedic Surgery

## 2024-09-22 DIAGNOSIS — S42301G Unspecified fracture of shaft of humerus, right arm, subsequent encounter for fracture with delayed healing: Secondary | ICD-10-CM

## 2024-10-19 ENCOUNTER — Other Ambulatory Visit: Payer: Self-pay | Admitting: Orthopedic Surgery

## 2024-11-02 NOTE — Patient Instructions (Addendum)
 SURGICAL WAITING ROOM VISITATION  Patients having surgery or a procedure may have no more than 2 support people in the waiting area - these visitors may rotate.    Children ages 81 and under will not be able to visit patients in [facility] under most circumstances.   Visitors with respiratory illnesses are discouraged from visiting and should remain at home.  If the patient needs to stay at the hospital during part of their recovery, the visitor guidelines for inpatient rooms apply. Pre-op nurse will coordinate an appropriate time for 1 support person to accompany patient in pre-op.  This support person may not rotate.    Please refer to the [facility] website for the visitor guidelines for Inpatients (after your surgery is over and you are in a regular room).    Your procedure is scheduled on: 11/18/23   Report to [facility] Main Entrance    Report to admitting at 10:15 AM   Call this number if you have problems the morning of surgery [phone]   Do not eat food :After Midnight.   After Midnight you may have the following liquids until 9:30 AM DAY OF SURGERY  Water Non-Citrus Juices (without pulp, NO RED-Apple, White grape, White cranberry) Black Coffee (NO MILK/CREAM OR CREAMERS, sugar ok)  Clear Tea (NO MILK/CREAM OR CREAMERS, sugar ok) regular and decaf                             Plain Jell-O (NO RED)                                           Fruit ices (not with fruit pulp, NO RED)                                     Popsicles (NO RED)                                                               Sports drinks like Gatorade (NO RED)     The day of surgery:  Drink ONE (1) Pre-Surgery G2 at 9:30 AM the morning of surgery. Drink in one sitting. Do not sip.  This drink was given to you during your hospital  pre-op appointment visit. Nothing else to drink after completing the  Pre-Surgery G2.          If you have questions, please contact your  surgeons office.   FOLLOW BOWEL PREP AND ANY ADDITIONAL PRE OP INSTRUCTIONS YOU RECEIVED FROM YOUR SURGEON'S OFFICE!!!     Oral Hygiene is also important to reduce your risk of infection.                                    Remember - BRUSH YOUR TEETH THE MORNING OF SURGERY WITH YOUR REGULAR TOOTHPASTE  DENTURES WILL BE REMOVED PRIOR TO SURGERY PLEASE DO NOT APPLY Poly grip OR ADHESIVES!!!   Stop all vitamins and herbal supplements 7 days  before surgery.   Take these medicines the morning of surgery with A SIP OF WATER: Felodipine , Isosorbide, Levothyroxine , Gabapentin   DO NOT TAKE ANY ORAL DIABETIC MEDICATIONS DAY OF YOUR SURGERY  How to Manage Your Diabetes Before and After Surgery  Why is it important to control my blood sugar before and after surgery? Improving blood sugar levels before and after surgery helps healing and can limit problems. A way of improving blood sugar control is eating a healthy diet by:  Eating less sugar and carbohydrates  Increasing activity/exercise  Talking with your doctor about reaching your blood sugar goals High blood sugars (greater than 180 mg/dL) can raise your risk of infections and slow your recovery, so you will need to focus on controlling your diabetes during the weeks before surgery. Make sure that the doctor who takes care of your diabetes knows about your planned surgery including the date and location.  How do I manage my blood sugar before surgery? Check your blood sugar at least 4 times a day, starting 2 days before surgery, to make sure that the level is not too high or low. Check your blood sugar the morning of your surgery when you wake up and every 2 hours until you get to the Short Stay unit. If your blood sugar is less than 70 mg/dL, you will need to treat for low blood sugar: Do not take insulin . Treat a low blood sugar (less than 70 mg/dL) with  cup of clear juice (cranberry or apple), 4 glucose tablets, OR glucose  gel. Recheck blood sugar in 15 minutes after treatment (to make sure it is greater than 70 mg/dL). If your blood sugar is not greater than 70 mg/dL on recheck, call [phone] for further instructions. Report your blood sugar to the short stay nurse when you get to Short Stay.  If you are admitted to the hospital after surgery: Your blood sugar will be checked by the staff and you will probably be given insulin  after surgery (instead of oral diabetes medicines) to make sure you have good blood sugar levels. The goal for blood sugar control after surgery is 80-180 mg/dL.  Reviewed and Endorsed by [facility] Patient Education Committee, August 2015                              You may not have any metal on your body including hair pins, jewelry, and body piercing             Do not wear make-up, lotions, powders, perfumes, or deodorant  Do not wear nail polish including gel and S&S, artificial/acrylic nails, or any other type of covering on natural nails including finger and toenails. If you have artificial nails, gel coating, etc. that needs to be removed by a nail salon please have this removed prior to surgery or surgery may need to be canceled/ delayed if the surgeon/ anesthesia feels like they are unable to be safely monitored.   Do not shave  48 hours prior to surgery.    Do not bring valuables to the hospital.  IS NOT             RESPONSIBLE   FOR VALUABLES.   Contacts, glasses, dentures or bridgework may not be worn into surgery.  DO NOT BRING YOUR HOME MEDICATIONS TO THE HOSPITAL. PHARMACY WILL DISPENSE MEDICATIONS LISTED ON YOUR MEDICATION LIST TO YOU DURING YOUR ADMISSION IN THE HOSPITAL!  Patients discharged on the day of surgery will not be allowed to drive home.  Someone NEEDS to stay with you for the first 24 hours after anesthesia.   Special Instructions: Bring a copy of your healthcare power of attorney and living will documents the day of surgery if you  haven't scanned them before.              Please read over the following fact sheets you were given: IF YOU HAVE QUESTIONS ABOUT YOUR PRE-OP INSTRUCTIONS PLEASE CALL [phone].   If you received a COVID test during your pre-op visit  it is requested that you wear a mask when out in public, stay away from anyone that may not be feeling well and notify your surgeon if you develop symptoms. If you test positive for Covid or have been in contact with anyone that has tested positive in the last 10 days please notify you surgeon.    [place] - Preparing for Surgery Before surgery, you can play an important role.  Because skin is not sterile, your skin needs to be as free of germs as possible.  You can reduce the number of germs on your skin by washing with CHG (chlorahexidine gluconate) soap before surgery.  CHG is an antiseptic cleaner which kills germs and bonds with the skin to continue killing germs even after washing. Please DO NOT use if you have an allergy to CHG or antibacterial soaps.  If your skin becomes reddened/irritated stop using the CHG and inform your nurse when you arrive at Short Stay. Do not shave (including legs and underarms) for at least 48 hours prior to the first CHG shower.  You may shave your face/neck.  Please follow these instructions carefully:  1.  Shower with CHG Soap the night before surgery ONLY (DO NOT USE THE SOAP THE MORNING OF SURGERY).  2.  If you choose to wash your hair, wash your hair first as usual with your normal  shampoo.  3.  After you shampoo, rinse your hair and body thoroughly to remove the shampoo.                             4.  Use CHG as you would any other liquid soap.  You can apply chg directly to the skin and wash.  Gently with a scrungie or clean washcloth.  5.  Apply the CHG Soap to your body ONLY FROM THE NECK DOWN.   Do   not use on face/ open                           Wound or open sores. Avoid contact with eyes, ears mouth and    genitals (private parts).                       Wash face,  Genitals (private parts) with your normal soap.             6.  Wash thoroughly, paying special attention to the area where your    surgery  will be performed.  7.  Thoroughly rinse your body with warm water from the neck down.  8.  DO NOT shower/wash with your normal soap after using and rinsing off the CHG Soap.                9.  Pat yourself dry with  a clean towel.            10.  Wear clean pajamas.            11.  Place clean sheets on your bed the night of your first shower and do not  sleep with pets. Day of Surgery : Do not apply any CHG, lotions/deodorants the morning of surgery.  Please wear clean clothes to the hospital/surgery center.  FAILURE TO FOLLOW THESE INSTRUCTIONS MAY RESULT IN THE CANCELLATION OF YOUR SURGERY  PATIENT SIGNATURE_________________________________  NURSE SIGNATURE__________________________________  ________________________________________________________________________  Nasario Exon  An incentive spirometer is a tool that can help keep your lungs clear and active. This tool measures how well you are filling your lungs with each breath. Taking long deep breaths may help reverse or decrease the chance of developing breathing (pulmonary) problems (especially infection) following: A long period of time when you are unable to move or be active. BEFORE THE PROCEDURE  If the spirometer includes an indicator to show your best effort, your nurse or respiratory therapist will set it to a desired goal. If possible, sit up straight or lean slightly forward. Try not to slouch. Hold the incentive spirometer in an upright position. INSTRUCTIONS FOR USE  Sit on the edge of your bed if possible, or sit up as far as you can in bed or on a chair. Hold the incentive spirometer in an upright position. Breathe out normally. Place the mouthpiece in your mouth and seal your lips tightly around  it. Breathe in slowly and as deeply as possible, raising the piston or the ball toward the top of the column. Hold your breath for 3-5 seconds or for as long as possible. Allow the piston or ball to fall to the bottom of the column. Remove the mouthpiece from your mouth and breathe out normally. Rest for a few seconds and repeat Steps 1 through 7 at least 10 times every 1-2 hours when you are awake. Take your time and take a few normal breaths between deep breaths. The spirometer may include an indicator to show your best effort. Use the indicator as a goal to work toward during each repetition. After each set of 10 deep breaths, practice coughing to be sure your lungs are clear. If you have an incision (the cut made at the time of surgery), support your incision when coughing by placing a pillow or rolled up towels firmly against it. Once you are able to get out of bed, walk around indoors and cough well. You may stop using the incentive spirometer when instructed by your caregiver.  RISKS AND COMPLICATIONS Take your time so you do not get dizzy or light-headed. If you are in pain, you may need to take or ask for pain medication before doing incentive spirometry. It is harder to take a deep breath if you are having pain. AFTER USE Rest and breathe slowly and easily. It can be helpful to keep track of a log of your progress. Your caregiver can provide you with a simple table to help with this. If you are using the spirometer at home, follow these instructions: SEEK MEDICAL CARE IF:  You are having difficultly using the spirometer. You have trouble using the spirometer as often as instructed. Your pain medication is not giving enough relief while using the spirometer. You develop fever of 100.5 F (38.1 C) or higher. SEEK IMMEDIATE MEDICAL CARE IF:  You cough up bloody sputum that had not been present before. You develop  fever of 102 F (38.9 C) or greater. You develop worsening pain at or  near the incision site. MAKE SURE YOU:  Understand these instructions. Will watch your condition. Will get help right away if you are not doing well or get worse. Document Released: 03/09/2007 Document Revised: 01/19/2012 Document Reviewed: 05/10/2007 [facility] Patient Information 2014 [place], [place].   ________________________________________________________________________

## 2024-11-02 NOTE — Progress Notes (Addendum)
 History of dementia. She is oriented to self, time, and place. She could not tell me what kind of surgery she was having. Daughter signed her consent form. Requested she bring POA forms DOS. Patient is a poor historian.  Date of COVID positive in last 90 days:  PCP - Lavenia Beaver, MD Cardiologist -  Nephrologist- Bonnell Sherry, MD Pulmonary- Lavelle Servant, MD  Chest x-ray - N/A EKG - 08/18/24 Epic Stress Test - N/A ECHO - 12/28/23 [facility] Cardiac Cath - N/A Pacemaker/ICD device last checked:N/A Spinal Cord Stimulator:N/A  Bowel Prep - N/A  Sleep Study - yes CPAP -  no longer needs  Fasting Blood Sugar - patient and family unsure Checks Blood Sugar  less than once a week  Last dose of GLP1 agonist-  N/A GLP1 instructions:  Do not take after     Last dose of SGLT-2 inhibitors-  N/A SGLT-2 instructions:  Do not take after     Blood Thinner Instructions:  Aspirin  Instructions: ASA 325, hold 5-7 days Last Dose:  Activity level: Can go up a flight of stairs and perform activities of daily living without stopping and without symptoms of chest pain. SOB with activity, nothing new per family has hx asthma  Anesthesia review: HTN, CVA with stent, DM2, CKD, BP 212/80, 214/75, and 215/87. Denies any symptoms. She took her BP meds this morning. [place], [place] evaluated patient and instructed to monitor at home and call PCP today.  Patient denies shortness of breath, fever, cough and chest pain at PAT appointment  Patient verbalized understanding of instructions that were given to them at the PAT appointment. Patient was also instructed that they will need to review over the PAT instructions again at home before surgery.

## 2024-11-08 ENCOUNTER — Other Ambulatory Visit: Payer: Self-pay

## 2024-11-08 ENCOUNTER — Encounter (HOSPITAL_COMMUNITY)
Admission: RE | Admit: 2024-11-08 | Discharge: 2024-11-08 | Disposition: A | Discharge status: Home / Self Care | Source: Ambulatory Visit | Attending: Orthopedic Surgery | Admitting: Orthopedic Surgery

## 2024-11-08 ENCOUNTER — Encounter (HOSPITAL_COMMUNITY): Payer: Self-pay

## 2024-11-08 VITALS — BP 212/80 | HR 60 | Temp 97.8°F | Resp 16 | Ht 63.0 in | Wt 194.0 lb

## 2024-11-08 DIAGNOSIS — N184 Chronic kidney disease, stage 4 (severe): Secondary | ICD-10-CM | POA: Diagnosis not present

## 2024-11-08 DIAGNOSIS — Z01818 Encounter for other preprocedural examination: Secondary | ICD-10-CM | POA: Insufficient documentation

## 2024-11-08 DIAGNOSIS — E1122 Type 2 diabetes mellitus with diabetic chronic kidney disease: Secondary | ICD-10-CM | POA: Diagnosis not present

## 2024-11-08 LAB — BASIC METABOLIC PANEL WITH GFR
Anion gap: 12 (ref 5–15)
BUN: 38 mg/dL — ABNORMAL HIGH (ref 8–23)
CO2: 24 mmol/L (ref 22–32)
Calcium: 10.1 mg/dL (ref 8.9–10.3)
Chloride: 104 mmol/L (ref 98–111)
Creatinine, Ser: 1.88 mg/dL — ABNORMAL HIGH (ref 0.44–1.00)
GFR, Estimated: 26 mL/min — ABNORMAL LOW
Glucose, Bld: 114 mg/dL — ABNORMAL HIGH (ref 70–99)
Potassium: 4.1 mmol/L (ref 3.5–5.1)
Sodium: 141 mmol/L (ref 135–145)

## 2024-11-08 LAB — CBC
HCT: 35.9 % — ABNORMAL LOW (ref 36.0–46.0)
Hemoglobin: 11.2 g/dL — ABNORMAL LOW (ref 12.0–15.0)
MCH: 29.8 pg (ref 26.0–34.0)
MCHC: 31.2 g/dL (ref 30.0–36.0)
MCV: 95.5 fL (ref 80.0–100.0)
Platelets: 241 K/uL (ref 150–400)
RBC: 3.76 MIL/uL — ABNORMAL LOW (ref 3.87–5.11)
RDW: 14.2 % (ref 11.5–15.5)
WBC: 7.3 K/uL (ref 4.0–10.5)
nRBC: 0 % (ref 0.0–0.2)

## 2024-11-08 LAB — GLUCOSE, CAPILLARY: Glucose-Capillary: 119 mg/dL — ABNORMAL HIGH (ref 70–99)

## 2024-11-08 LAB — HEMOGLOBIN A1C
Hgb A1c MFr Bld: 5.9 % — ABNORMAL HIGH (ref 4.8–5.6)
Mean Plasma Glucose: 122.63 mg/dL

## 2024-11-09 ENCOUNTER — Encounter (HOSPITAL_COMMUNITY): Payer: Self-pay

## 2024-11-09 NOTE — Progress Notes (Signed)
 " Case: [id number] Date/Time: 11/17/24 1217   Procedure: OPEN REDUCTION INTERNAL FIXATION (ORIF) HUMERAL SHAFT FRACTURE (Right)   Anesthesia type: Choice   Diagnosis: Closed displaced comminuted fracture of shaft of right humerus with delayed healing, subsequent encounter [S42.351G]   Pre-op diagnosis: DISPLACED COMMINUTED FRACTURE OF SHAFT OF HUMERUS, RIGHT ARM, SUBSEQUENT ENCOUNTER FOR FRACTURE WITH DELAYED HEALING   Location: [facility] ROOM 07 / [facility]   Surgeons: Dozier Soulier, MD       DISCUSSION: Maria Cunningham is an 83 yo female with PMH of HTN, asthma, moderate dementia (A&O to self and place at PAT visit), 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  Patient with hx of moderate dementia. She is accompanied by her daughter. Her BP readings in pre op were consistently 215/80. Daughter reports she took her blood pressure medication 1 hour prior to her appointment. She states her BP is usually not this high. On review of multiple provider visits BP has ranged from 130s systolic to 219 systolic, but mostly seems uncontrolled with multiple readings >160. On exam patient is alert. She is mildly confused which is her baseline. She denies any neurologic symptoms, CP or SOB. Emphasized to daughter that while surgery is urgent her BP is also extremely high and they can either go to the ED or they can call her PCP today since she is asymptomatic. They opted to call PCP.  Seen by Pulmonology at [facility] on 10/12/24 for asthma which is mild, intermittent. Advised continue inhalers and f/u in 6 months.  Seen by PCP on 10/12/24 for vaginal bleeding. Patient is s/p hysterectomy. CBC ordered which shows stable anemia. Advised continue to observe and refer to GYN if it continues  Seen by Neurology on 08/30/24 for hx of syncope in Jan 2025, hx of cerebral aneurysm s/p stent, dementia and hx of CVA. W/u for syncope was unrevealing. Dementia noted to be worsening. Home health ordered  for PT/OT, SLP, social work, nursing  Patient follows with Nephrology for CKD4. Last seen on 06/01/24. Kidney function stable on pre op labs.  Addendum 1/7:  Called patient's daughter Maria Cunningham. She states she has checked her mom's BP and is still high but improved from when she was in preop ~160/70. She states she actually didn't give her mom her blood pressure pill that morning. I discussed with her that she needs to make sure that medicine is given DOS to ensure BP is not too high when she gets to short stay and she verbalized understanding.  VS: BP (!) 212/80   Pulse 60   Temp 36.6 C (Oral)   Resp 16   Ht 5' 3 (1.6 m)   Wt 88 kg   SpO2 100%   BMI 34.37 kg/m   PROVIDERS: Sherial Bail, MD   LABS: Labs reviewed: Acceptable for surgery. (all labs ordered are listed, but only abnormal results are displayed)  Labs Reviewed  HEMOGLOBIN A1C - Abnormal; Notable for the following components:      Result Value   Hgb A1c MFr Bld 5.9 (*)    All other components within normal limits  BASIC METABOLIC PANEL WITH GFR - Abnormal; Notable for the following components:   Glucose, Bld 114 (*)    BUN 38 (*)    Creatinine, Ser 1.88 (*)    GFR, Estimated 26 (*)    All other components within normal limits  CBC - Abnormal; Notable for the following components:   RBC 3.76 (*)  Hemoglobin 11.2 (*)    HCT 35.9 (*)    All other components within normal limits  GLUCOSE, CAPILLARY - Abnormal; Notable for the following components:   Glucose-Capillary 119 (*)    All other components within normal limits     EKG 08/18/24:  Sinus rhythm   Echo 01/30/23:  IMPRESSIONS    1. Left ventricular ejection fraction, by estimation, is 60 to 65%. Left ventricular ejection fraction by PLAX is 64 %. The left ventricle has normal function. The left ventricle has no regional wall motion abnormalities. There is mild left ventricular hypertrophy. Left ventricular diastolic parameters are  consistent with Grade I diastolic dysfunction (impaired relaxation).  2. Right ventricular systolic function is low normal. The right ventricular size is normal.  3. The mitral valve is abnormal. Trivial mitral valve regurgitation.  4. The aortic valve is tricuspid. Aortic valve regurgitation is not visualized.  5. Agitated saline contrast bubble study was negative, with no evidence of any interatrial shunt.  Comparison(s): Changes from prior study are noted. 08/19/2022: LVEF 65-70%.  Past Medical History:  Diagnosis Date   Allergy    Asthma    Chronic kidney disease    Dementia (HCC)    Diabetes mellitus without complication (HCC)    Gout    Hypertension    Hypothyroidism    Stroke [facility])     Past Surgical History:  Procedure Laterality Date   ABDOMINAL HYSTERECTOMY     BACK SURGERY     COLONOSCOPY     IR 3D INDEPENDENT WKST  05/25/2023   IR ANGIO INTRA EXTRACRAN SEL INTERNAL CAROTID UNI L MOD SED  05/25/2023   IR CT HEAD LTD  05/25/2023   IR NEURO EACH ADD'L AFTER BASIC UNI LEFT (MS)  05/28/2023   IR RADIOLOGIST EVAL & MGMT  02/17/2023   IR RADIOLOGIST EVAL & MGMT  12/22/2023   IR TRANSCATH/EMBOLIZ  05/25/2023   RADIOLOGY WITH ANESTHESIA N/A 05/25/2023   Procedure: Brain aneurysm embolization;  Surgeon: Dolphus Carrion, MD;  Location: [facility];  Service: Radiology;  Laterality: N/A;   ROTATOR CUFF REPAIR Right     MEDICATIONS:  aspirin  325 MG tablet   aspirin  EC 81 MG tablet   atorvastatin  (LIPITOR) 40 MG tablet   calcitRIOL (ROCALTROL) 0.25 MCG capsule   clopidogrel  (PLAVIX ) 75 MG tablet   donepezil (ARICEPT) 5 MG tablet   felodipine  (PLENDIL ) 5 MG 24 hr tablet   Fluticasone-Salmeterol (ADVAIR) 250-50 MCG/DOSE AEPB   gabapentin  (NEURONTIN ) 100 MG capsule   isosorbide mononitrate (IMDUR) 30 MG 24 hr tablet   levothyroxine  (SYNTHROID ) 50 MCG tablet   telmisartan-hydrochlorothiazide  (MICARDIS HCT) 40-12.5 MG tablet   No current facility-administered medications  for this encounter.    Burnard CHRISTELLA Odis DEVONNA [facility] Short Stay/Anesthesiology [facility] Phone [phone] 11/09/2024 2:55 PM       "

## 2024-11-16 NOTE — Anesthesia Preprocedure Evaluation (Addendum)
 "                                  Anesthesia Evaluation  Patient identified by MRN, date of birth, ID band Patient awake    Reviewed: Allergy & Precautions, NPO status , Patient's Chart, lab work & pertinent test results  History of Anesthesia Complications Negative for: history of anesthetic complications  Airway Mallampati: I  TM Distance: >3 FB Neck ROM: Full    Dental  (+) Edentulous Upper, Missing, Dental Advisory Given,    Pulmonary asthma    breath sounds clear to auscultation       Cardiovascular hypertension, Pt. on medications (-) angina  Rhythm:Regular Rate:Normal  01/2023 ECHO: EF 60-65%, normal LVF, mild LVH, Grade 1 DD, normal RVF, trivial MR   Neuro/Psych  PSYCHIATRIC DISORDERS     Dementia Cerebral aneurysm: for intervention CVA    GI/Hepatic negative GI ROS, Neg liver ROS,,,  Endo/Other  diabetes, Type 2Hypothyroidism    Renal/GU Renal InsufficiencyRenal disease     Musculoskeletal   Abdominal   Peds  Hematology plavix    Anesthesia Other Findings   Reproductive/Obstetrics                              Anesthesia Physical Anesthesia Plan  ASA: 3  Anesthesia Plan: Regional and MAC   Post-op Pain Management: Tylenol  PO (pre-op)* and Celebrex PO (pre-op)*   Induction: Intravenous  PONV Risk Score and Plan: 3 and Ondansetron , Treatment may vary due to age or medical condition and Propofol  infusion  Airway Management Planned: Simple Face Mask, Mask and Natural Airway  Additional Equipment:   Intra-op Plan:   Post-operative Plan: Extubation in OR  Informed Consent: I have reviewed the patients History and Physical, chart, labs and discussed the procedure including the risks, benefits and alternatives for the proposed anesthesia with the patient or authorized representative who has indicated his/her understanding and acceptance.     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   Patient with hx of moderate dementia. She is accompanied by her daughter. Her BP readings in pre op were consistently 215/80. Daughter reports she took her blood pressure medication 1 hour prior to her appointment. She states her BP is usually not this high. On review of multiple provider visits BP has ranged from 130s systolic to 219 systolic, but mostly seems uncontrolled with multiple readings >160. On exam patient is alert. She is mildly confused which is her baseline. She denies any neurologic symptoms, CP or SOB. Emphasized to daughter that while surgery is urgent her BP is also extremely high and they can either go to the ED or they can call her PCP today since she is asymptomatic. They opted to call PCP.   Seen by Pulmonology at [facility] on 10/12/24 for asthma which is mild, intermittent. Advised continue inhalers and f/u in 6 months.   Seen by PCP on 10/12/24 for vaginal bleeding. Patient is s/p hysterectomy. CBC ordered which shows stable anemia. Advised continue to observe and refer to GYN if it continues   Seen by Neurology on  08/30/24 for hx of syncope in Jan 2025, hx of cerebral aneurysm s/p stent, dementia and hx of CVA. W/u for syncope was unrevealing. Dementia noted to be worsening. Home health ordered for PT/OT, SLP, social work, nursing   Patient follows with Nephrology for CKD4. Last seen on 06/01/24. Kidney function stable on pre op labs.   Addendum 1/7:  Called patient's daughter Jodette Wik. She states she has checked her mom's BP and is still high but improved from when she was in preop ~160/70. She states she actually didn't give her mom her blood pressure pill that morning. I discussed with her that  she needs to make sure that medicine is given DOS to ensure BP is not too high when she gets to short stay and she verbalized understanding.   VS: BP (!) 212/80   Pulse 60   Temp 36.6 C (Oral)   Resp 16   Ht 5' 3 (1.6 m)   Wt 88 kg   SpO2 100%   BMI 34.37 kg/m  )         Anesthesia Quick Evaluation  "

## 2024-11-17 ENCOUNTER — Ambulatory Visit (HOSPITAL_COMMUNITY)
Admission: RE | Admit: 2024-11-17 | Discharge: 2024-11-17 | Disposition: A | Discharge status: Home / Self Care | Attending: Orthopedic Surgery | Admitting: Orthopedic Surgery

## 2024-11-17 ENCOUNTER — Ambulatory Visit (HOSPITAL_COMMUNITY): Payer: Self-pay | Admitting: Anesthesiology

## 2024-11-17 ENCOUNTER — Other Ambulatory Visit: Payer: Self-pay

## 2024-11-17 ENCOUNTER — Ambulatory Visit (HOSPITAL_COMMUNITY)

## 2024-11-17 ENCOUNTER — Ambulatory Visit (HOSPITAL_COMMUNITY): Payer: Self-pay | Admitting: Medical

## 2024-11-17 ENCOUNTER — Encounter (HOSPITAL_COMMUNITY): Payer: Self-pay | Admitting: Orthopedic Surgery

## 2024-11-17 ENCOUNTER — Encounter (HOSPITAL_COMMUNITY)
Admission: RE | Disposition: A | Discharge status: Home / Self Care | Payer: Self-pay | Source: Home / Self Care | Attending: Orthopedic Surgery

## 2024-11-17 DIAGNOSIS — I129 Hypertensive chronic kidney disease with stage 1 through stage 4 chronic kidney disease, or unspecified chronic kidney disease: Secondary | ICD-10-CM | POA: Diagnosis not present

## 2024-11-17 DIAGNOSIS — S42301K Unspecified fracture of shaft of humerus, right arm, subsequent encounter for fracture with nonunion: Secondary | ICD-10-CM | POA: Insufficient documentation

## 2024-11-17 DIAGNOSIS — X58XXXA Exposure to other specified factors, initial encounter: Secondary | ICD-10-CM | POA: Insufficient documentation

## 2024-11-17 DIAGNOSIS — Z79899 Other long term (current) drug therapy: Secondary | ICD-10-CM | POA: Diagnosis not present

## 2024-11-17 DIAGNOSIS — E039 Hypothyroidism, unspecified: Secondary | ICD-10-CM | POA: Insufficient documentation

## 2024-11-17 DIAGNOSIS — E119 Type 2 diabetes mellitus without complications: Secondary | ICD-10-CM | POA: Diagnosis not present

## 2024-11-17 DIAGNOSIS — I10 Essential (primary) hypertension: Secondary | ICD-10-CM

## 2024-11-17 DIAGNOSIS — F0390 Unspecified dementia without behavioral disturbance: Secondary | ICD-10-CM | POA: Diagnosis not present

## 2024-11-17 DIAGNOSIS — N189 Chronic kidney disease, unspecified: Secondary | ICD-10-CM | POA: Diagnosis not present

## 2024-11-17 DIAGNOSIS — J45909 Unspecified asthma, uncomplicated: Secondary | ICD-10-CM | POA: Diagnosis not present

## 2024-11-17 DIAGNOSIS — Z8673 Personal history of transient ischemic attack (TIA), and cerebral infarction without residual deficits: Secondary | ICD-10-CM | POA: Insufficient documentation

## 2024-11-17 DIAGNOSIS — Z8249 Family history of ischemic heart disease and other diseases of the circulatory system: Secondary | ICD-10-CM | POA: Insufficient documentation

## 2024-11-17 DIAGNOSIS — E1122 Type 2 diabetes mellitus with diabetic chronic kidney disease: Secondary | ICD-10-CM | POA: Insufficient documentation

## 2024-11-17 HISTORY — PX: ORIF HUMERUS FRACTURE: SHX2126

## 2024-11-17 LAB — GLUCOSE, CAPILLARY
Glucose-Capillary: 93 mg/dL (ref 70–99)
Glucose-Capillary: 125 mg/dL — ABNORMAL HIGH (ref 70–99)

## 2024-11-17 MED ORDER — METHOCARBAMOL 500 MG PO TABS: 500.0000 mg | ORAL_TABLET | Freq: Four times a day (QID) | ORAL | Status: DC | PRN

## 2024-11-17 MED ORDER — HEPARIN SODIUM (PORCINE) 5000 UNIT/ML IJ SOLN
INTRAMUSCULAR | Status: AC
  Filled 2024-11-17: qty 1

## 2024-11-17 MED ORDER — FENTANYL CITRATE (PF) 100 MCG/2ML IJ SOLN: INTRAMUSCULAR | Status: DC | PRN

## 2024-11-17 MED ORDER — POVIDONE-IODINE 7.5 % EX SOLN: Freq: Once | CUTANEOUS | Status: DC

## 2024-11-17 MED ORDER — LACTATED RINGERS IV SOLN: INTRAVENOUS | Status: DC

## 2024-11-17 MED ORDER — OXYCODONE HCL 5 MG PO TABS: 5.0000 mg | ORAL_TABLET | Freq: Four times a day (QID) | ORAL | 0 refills | Status: AC | PRN

## 2024-11-17 MED ORDER — CHLORHEXIDINE GLUCONATE 0.12 % MT SOLN
15.0000 mL | Freq: Once | OROMUCOSAL | Status: AC
  Administered 2024-11-17: 15 mL via OROMUCOSAL

## 2024-11-17 MED ORDER — 0.9 % SODIUM CHLORIDE (POUR BTL) OPTIME
TOPICAL | Status: DC | PRN
  Administered 2024-11-17: 1000 mL

## 2024-11-17 MED ORDER — SODIUM CHLORIDE 0.9% FLUSH
INTRAVENOUS | Status: DC | PRN
  Administered 2024-11-17: 10 mL

## 2024-11-17 MED ORDER — PHENYLEPHRINE HCL-NACL 20-0.9 MG/250ML-% IV SOLN
INTRAVENOUS | Status: DC | PRN
  Administered 2024-11-17: 50 ug/min via INTRAVENOUS

## 2024-11-17 MED ORDER — TIZANIDINE HCL 2 MG PO TABS: 2.0000 mg | ORAL_TABLET | Freq: Three times a day (TID) | ORAL | 0 refills | Status: AC | PRN

## 2024-11-17 MED ORDER — METHOCARBAMOL 1000 MG/10ML IJ SOLN: 500.0000 mg | Freq: Four times a day (QID) | INTRAMUSCULAR | Status: DC | PRN

## 2024-11-17 MED ORDER — EPHEDRINE SULFATE (PRESSORS) 25 MG/5ML IV SOSY
PREFILLED_SYRINGE | INTRAVENOUS | Status: DC | PRN
  Administered 2024-11-17: 5 mg via INTRAVENOUS
  Administered 2024-11-17 (×2): 10 mg via INTRAVENOUS

## 2024-11-17 MED ORDER — BUPIVACAINE-EPINEPHRINE (PF) 0.5% -1:200000 IJ SOLN
INTRAMUSCULAR | Status: DC | PRN
  Administered 2024-11-17: 10 mL via PERINEURAL

## 2024-11-17 MED ORDER — ORAL CARE MOUTH RINSE: 15.0000 mL | Freq: Once | OROMUCOSAL | Status: AC

## 2024-11-17 MED ORDER — MEPERIDINE HCL 25 MG/ML IJ SOLN: 6.2500 mg | INTRAMUSCULAR | Status: DC | PRN

## 2024-11-17 MED ORDER — BUPIVACAINE LIPOSOME 1.3 % IJ SUSP
INTRAMUSCULAR | Status: DC | PRN
  Administered 2024-11-17: 10 mL via PERINEURAL

## 2024-11-17 MED ORDER — ONDANSETRON HCL 4 MG/2ML IJ SOLN
INTRAMUSCULAR | Status: DC | PRN
  Administered 2024-11-17: 4 mg via INTRAVENOUS

## 2024-11-17 MED ORDER — INSULIN ASPART 100 UNIT/ML IJ SOLN: 0.0000 [IU] | INTRAMUSCULAR | Status: DC | PRN

## 2024-11-17 MED ORDER — FENTANYL CITRATE (PF) 50 MCG/ML IJ SOSY
50.0000 ug | PREFILLED_SYRINGE | INTRAMUSCULAR | Status: DC | PRN
  Administered 2024-11-17: 50 ug via INTRAVENOUS
  Filled 2024-11-17: qty 2

## 2024-11-17 MED ORDER — OXYCODONE HCL 5 MG/5ML PO SOLN: 5.0000 mg | Freq: Once | ORAL | Status: DC | PRN

## 2024-11-17 MED ORDER — FENTANYL CITRATE (PF) 50 MCG/ML IJ SOSY: 25.0000 ug | PREFILLED_SYRINGE | INTRAMUSCULAR | Status: DC | PRN

## 2024-11-17 MED ORDER — PHENYLEPHRINE 80 MCG/ML (10ML) SYRINGE FOR IV PUSH (FOR BLOOD PRESSURE SUPPORT)
PREFILLED_SYRINGE | INTRAVENOUS | Status: AC
  Filled 2024-11-17: qty 10

## 2024-11-17 MED ORDER — FENTANYL CITRATE (PF) 100 MCG/2ML IJ SOLN
INTRAMUSCULAR | Status: AC
  Filled 2024-11-17: qty 2

## 2024-11-17 MED ORDER — OXYCODONE HCL 5 MG PO TABS: 5.0000 mg | ORAL_TABLET | Freq: Once | ORAL | Status: DC | PRN

## 2024-11-17 MED ORDER — SODIUM CHLORIDE (PF) 0.9 % IJ SOLN
INTRAMUSCULAR | Status: AC
  Filled 2024-11-17: qty 10

## 2024-11-17 MED ORDER — DEXAMETHASONE SOD PHOSPHATE PF 10 MG/ML IJ SOLN
INTRAMUSCULAR | Status: DC | PRN
  Administered 2024-11-17: 4 mg via INTRAVENOUS

## 2024-11-17 MED ORDER — PROPOFOL 10 MG/ML IV BOLUS
INTRAVENOUS | Status: AC
  Filled 2024-11-17: qty 20

## 2024-11-17 MED ORDER — EPHEDRINE 5 MG/ML INJ
INTRAVENOUS | Status: AC
  Filled 2024-11-17: qty 5

## 2024-11-17 MED ORDER — CEFAZOLIN SODIUM-DEXTROSE 2-4 GM/100ML-% IV SOLN
2.0000 g | INTRAVENOUS | Status: AC
  Administered 2024-11-17: 2 g via INTRAVENOUS
  Filled 2024-11-17: qty 100

## 2024-11-17 MED ORDER — FENTANYL CITRATE (PF) 100 MCG/2ML IJ SOLN
INTRAMUSCULAR | Status: DC | PRN
  Administered 2024-11-17: 50 ug via INTRAVENOUS

## 2024-11-17 MED ORDER — PROPOFOL 1000 MG/100ML IV EMUL
INTRAVENOUS | Status: AC
  Filled 2024-11-17: qty 100

## 2024-11-17 MED ORDER — ACETAMINOPHEN 500 MG PO TABS
1000.0000 mg | ORAL_TABLET | Freq: Once | ORAL | Status: AC
  Administered 2024-11-17: 1000 mg via ORAL
  Filled 2024-11-17: qty 2

## 2024-11-17 MED ORDER — PROPOFOL 500 MG/50ML IV EMUL
INTRAVENOUS | Status: DC | PRN
  Administered 2024-11-17: 30 ug/kg/min via INTRAVENOUS
  Administered 2024-11-17: 30 mg via INTRAVENOUS
  Administered 2024-11-17: 150 mg via INTRAVENOUS

## 2024-11-17 MED ORDER — ONDANSETRON HCL 4 MG/2ML IJ SOLN: 4.0000 mg | Freq: Once | INTRAMUSCULAR | Status: DC | PRN

## 2024-11-17 MED ORDER — HEPARIN SODIUM (PORCINE) 5000 UNIT/ML IJ SOLN
INTRAMUSCULAR | Status: DC | PRN
  Administered 2024-11-17: 5000 [IU]

## 2024-11-17 NOTE — Anesthesia Procedure Notes (Signed)
 Anesthesia Regional Block: Supraclavicular block   Pre-Anesthetic Checklist: , timeout performed,  Correct Patient, Correct Site, Correct Laterality,  Correct Procedure, Correct Position, site marked,  Risks and benefits discussed,  Surgical consent,  Pre-op evaluation,  At surgeon's request and post-op pain management  Laterality: Right  Prep: chloraprep       Needles:  Injection technique: Single-shot  Needle Type: Echogenic Stimulator Needle     Needle Length: 5cm  Needle Gauge: 22     Additional Needles:   Procedures:, nerve stimulator,,, ultrasound used (permanent image in chart),,     Nerve Stimulator or Paresthesia:  Response: hand, 0.45 mA  Additional Responses:   Narrative:  Start time: 11/17/2024 9:10 AM End time: 11/17/2024 9:15 AM Injection made incrementally with aspirations every 5 mL.  Performed by: Personally  Anesthesiologist: Mallory Manus, MD  Additional Notes: Functioning IV was confirmed and monitors were applied.  A 50mm 22ga Arrow echogenic stimulator needle was used. Sterile prep and drape,hand hygiene and sterile gloves were used. Ultrasound guidance: relevant anatomy identified, needle position confirmed, local anesthetic spread visualized around nerve(s)., vascular puncture avoided.  Image printed for medical record. Negative aspiration and negative test dose prior to incremental administration of local anesthetic. The patient tolerated the procedure well.

## 2024-11-17 NOTE — Anesthesia Procedure Notes (Signed)
 Procedure Name: LMA Insertion Date/Time: 11/17/2024 7:44 AM  Performed by: Nada Corean CROME, CRNAPre-anesthesia Checklist: Patient identified, Emergency Drugs available, Suction available, Patient being monitored and Timeout performed Patient Re-evaluated:Patient Re-evaluated prior to induction Oxygen Delivery Method: Circle system utilized Preoxygenation: Pre-oxygenation with 100% oxygen Induction Type: IV induction Ventilation: Mask ventilation without difficulty LMA: LMA inserted LMA Size: 4.0 Number of attempts: 1 Placement Confirmation: positive ETCO2 and breath sounds checked- equal and bilateral Tube secured with: Tape Dental Injury: Teeth and Oropharynx as per pre-operative assessment

## 2024-11-17 NOTE — Op Note (Signed)
 Procedures: OPEN REDUCTION INTERNAL FIXATION (ORIF) HUMERAL SHAFT FRACTURE Procedure Note  Maria Cunningham female 84 y.o. 11/17/2024  Preoperative diagnosis: Right humeral shaft nonunion  Postoperative diagnosis: Same  Procedure performed: #1 ORIF right humeral shaft nonunion #2 iliac crest bone marrow aspiration  Surgeons and Role:    DEWAINE Dozier Soulier, MD - Primary   Indications:  84 y.o. female with nonunion of right humeral shaft fracture despite adequate conservative management.  Indicated for surgical treatment to decrease pain and restore function.  Surgeon: Josefa LELON Dozier   Assistants: Jeoffrey Northern PA-C Amber was present and scrubbed throughout the procedure and was essential in positioning, retraction, exposure, and closure)  Anesthesia: General endotracheal anesthesia with preoperative interscalene block given by the attending anesthesiologist    Procedure Detail  OPEN REDUCTION INTERNAL FIXATION (ORIF) HUMERAL SHAFT FRACTURE  Findings: Anatomic reduction of the fracture with a Arthrex 3.5 stainless steel locking plate.  Bone marrow aspirate concentrate was used along with allograft BMP to promote union.  Estimated Blood Loss:  200 mL         Drains: none  Blood Given: none         Specimens: none        Complications:  * No complications entered in OR log *         Disposition: PACU - hemodynamically stable.         Condition: stable    Procedure:   DESCRIPTION OF PROCEDURE: The patient was identified in preoperative  holding area where I personally marked the operative site after  verifying site, side, and procedure with the patient. The patient was taken back  to the operating room where general anesthesia was induced without  complication  She was kept in the supine position with the operative extremity placed on a radiolucent hand table. After the appropriate timeout procedure the left iliac crest was prepped and draped out with towels.   A small stab incision was made over the palpable iliac crest and the bone aspirate trocar was malleted into the superior ridge of the anterior iliac crest.  30 cc syringes were then used to aspirate a total of 60 cc of bone marrow with a good draw.  The needle was withdrawn and the small stab incision was closed with 1 staple.  A small spot sterile dressing was applied.  The aspirate was handed off to the back table for further preparation.  At this point attention was turned to the right upper extremity which was prepped and draped in the standard sterile fashion.  A approximately 15 cm incision was made over the anterior upper arm.  Dissection was carried down to the fascia.  The cephalic vein was identified and taken laterally with the deltoid.  The deltopectoral interval was exploited proximally.  Distally the lateral edge of the biceps was identified and the biceps was moved medially exposing the underlying brachialis.  Great care distally was taken to avoid the radial nerve which was not directly visualized during the procedure.  The brachialis was then split longitudinally over the humeral shaft.  There was abundant scar tissue around the fracture site which was carefully dissected.  The nonunion site was identified and debrided.  There was some lateral callus over the fracture site which was removed with a large rondure to promote anatomic plate fixation.  The distal and proximal segments were both carefully debrided of callus and nonunion tissue.  The intramedullary canal was established proximally and distally.  The fracture was then  reduced and provisionally plate was placed.  X-ray showed anatomic reduction.  The plate needed a slight bend.  The plate was applied proximally with 1 nonlocking screw.  The fracture was then reduced beneath the plate and a distal compression screw was placed with excellent compression at the fracture site.  Prior to fixation of the fracture about half of the [facility]  allograft combination was placed in the fracture site.  At this point proximal and distal locking screws were placed for a total of 8 cortices proximal and distal to the fracture site.  Excellent fixation was noted.  The remainder of the bone graft/aspirate was then packed into around the fracture.  The deltoid insertion was then closed down to the pectoralis insertion with 0 Vicryl suture to promote healing here.  The wound was then closed in layers with 2-0 Vicryl and staples.  A bulky sterile dressing was applied.  The patient was placed in a sling, allowed to awaken from anesthesia transferred to the stretcher and taken to the recovery room in stable condition.    Postoperative plan: She will be observed in the recovery room.  If her pain is well-controlled with the regional block and she is hemodynamically stable she can be discharged home today with family.  Follow-up in 2 weeks for staple removal.

## 2024-11-17 NOTE — H&P (Signed)
 Maria Cunningham is an 84 y.o. female.   Chief Complaint:  R arm pain HPI: s/p R arm injury with R humeral shaft fracture initially treated nonoperatively with bracing, but has gone on to nonunion.  Past Medical History:  Diagnosis Date   Allergy    Asthma    Chronic kidney disease    Dementia (HCC)    Diabetes mellitus without complication (HCC)    Gout    Hypertension    Hypothyroidism    Stroke [facility])     Past Surgical History:  Procedure Laterality Date   ABDOMINAL HYSTERECTOMY     BACK SURGERY     COLONOSCOPY     IR 3D INDEPENDENT WKST  05/25/2023   IR ANGIO INTRA EXTRACRAN SEL INTERNAL CAROTID UNI L MOD SED  05/25/2023   IR CT HEAD LTD  05/25/2023   IR NEURO EACH ADD'L AFTER BASIC UNI LEFT (MS)  05/28/2023   IR RADIOLOGIST EVAL & MGMT  02/17/2023   IR RADIOLOGIST EVAL & MGMT  12/22/2023   IR TRANSCATH/EMBOLIZ  05/25/2023   RADIOLOGY WITH ANESTHESIA N/A 05/25/2023   Procedure: Brain aneurysm embolization;  Surgeon: Dolphus Carrion, MD;  Location: [facility];  Service: Radiology;  Laterality: N/A;   ROTATOR CUFF REPAIR Right     Family History  Problem Relation Age of Onset   Hypertension Sister    Social History:  reports that she has never smoked. She has never used smokeless tobacco. She reports that she does not drink alcohol and does not use drugs.  Allergies: Allergies[1]  Medications Prior to Admission  Medication Sig Dispense Refill   aspirin  325 MG tablet Take 325 mg by mouth daily.     calcitRIOL (ROCALTROL) 0.25 MCG capsule Take 0.25 mcg by mouth daily.     donepezil (ARICEPT) 5 MG tablet Take 5 mg by mouth daily.     felodipine  (PLENDIL ) 5 MG 24 hr tablet Take 10 mg by mouth daily.     Fluticasone-Salmeterol (ADVAIR) 250-50 MCG/DOSE AEPB Inhale 1 puff into the lungs in the morning and at bedtime.     gabapentin  (NEURONTIN ) 100 MG capsule Take 200 mg by mouth at bedtime.     isosorbide mononitrate (IMDUR) 30 MG 24 hr tablet Take 30 mg by mouth daily.      levothyroxine  (SYNTHROID ) 50 MCG tablet Take 50 mcg by mouth daily before breakfast.     telmisartan-hydrochlorothiazide  (MICARDIS HCT) 40-12.5 MG tablet Take 1 tablet by mouth daily.     aspirin  EC 81 MG tablet Take 81 mg by mouth daily. (Patient not taking: Reported on 11/08/2024)     atorvastatin  (LIPITOR) 40 MG tablet Take 1 tablet (40 mg total) by mouth daily. (Patient not taking: Reported on 05/22/2023) 30 tablet 0    No results found for this or any previous visit (from the past 48 hours). No results found.  Review of Systems  All other systems reviewed and are negative.   There were no vitals taken for this visit. Physical Exam Eyes:     Extraocular Movements: Extraocular movements intact.  Pulmonary:     Effort: Pulmonary effort is normal.  Musculoskeletal:     Comments: R arm TTP over mid humerus, NVID  Neurological:     Mental Status: She is alert.      Assessment/Plan s/p R arm injury with R humeral shaft fracture initially treated nonoperatively with bracing, but has gone on to nonunion. Plan ORIF with BMAC/bone grafting Risks / benefits of surgery discussed Consent  on chart  NPO for OR Preop antibiotics   Josefa LELON Herring, MD 11/17/2024, 8:32 AM       [1]  Allergies Allergen Reactions   Ezetimibe Other (See Comments)    Muscle Pain   Lovastatin Other (See Comments)    Muscle Pain   Amlodipine Cough        Quinapril Cough

## 2024-11-17 NOTE — Transfer of Care (Signed)
 Immediate Anesthesia Transfer of Care Note  Patient: Maria Cunningham  Procedure(s) Performed: OPEN REDUCTION INTERNAL FIXATION (ORIF) HUMERAL SHAFT FRACTURE (Right)  Patient Location: PACU  Anesthesia Type:General and Regional  Level of Consciousness: drowsy  Airway & Oxygen Therapy: Patient Spontanous Breathing and Patient connected to face mask oxygen  Post-op Assessment: Report given to RN and Post -op Vital signs reviewed and stable  Post vital signs: Reviewed and stable  Last Vitals:  Vitals Value Taken Time  BP 108/56 11/17/24 12:00  Temp    Pulse 59 11/17/24 12:01  Resp 12 11/17/24 12:01  SpO2 100 % 11/17/24 12:01  Vitals shown include unfiled device data.  Last Pain:  Vitals:   11/17/24 0858  TempSrc:   PainSc: 0-No pain         Complications: No notable events documented.

## 2024-11-17 NOTE — Anesthesia Procedure Notes (Addendum)
 Procedure Name: MAC Date/Time: 11/17/2024 9:33 AM  Performed by: Nada Corean CROME, CRNAPre-anesthesia Checklist: Patient identified, Emergency Drugs available, Suction available, Patient being monitored and Timeout performed Patient Re-evaluated:Patient Re-evaluated prior to induction Oxygen Delivery Method: Simple face mask Preoxygenation: Pre-oxygenation with 100% oxygen Induction Type: IV induction Placement Confirmation: positive ETCO2 Dental Injury: Teeth and Oropharynx as per pre-operative assessment

## 2024-11-17 NOTE — Anesthesia Postprocedure Evaluation (Signed)
"   Anesthesia Post Note  Patient: Maria Cunningham  Procedure(s) Performed: OPEN REDUCTION INTERNAL FIXATION (ORIF) HUMERAL SHAFT FRACTURE (Right)     Patient location during evaluation: PACU Anesthesia Type: Regional and General Level of consciousness: awake and alert Pain management: pain level controlled Vital Signs Assessment: post-procedure vital signs reviewed and stable Respiratory status: spontaneous breathing, nonlabored ventilation, respiratory function stable and patient connected to nasal cannula oxygen Cardiovascular status: blood pressure returned to baseline and stable Postop Assessment: no apparent nausea or vomiting Anesthetic complications: no   No notable events documented.  Last Vitals:  Vitals:   11/17/24 1157 11/17/24 1200  BP: (!) 119/51 (!) 108/56  Pulse: (!) 59 (!) 59  Resp: 12 15  Temp:    SpO2: 100% 100%    Last Pain:  Vitals:   11/17/24 0858  TempSrc:   PainSc: 0-No pain                 Louis Gaw      "

## 2024-11-17 NOTE — Discharge Instructions (Addendum)
 Discharge Instructions    A sling has been provided for you. You are to wear this at all times (except for bathing and dressing), until your first post operative visit with Dr. Dozier. Please also wear while sleeping at night. While you bath and dress, let the arm/elbow extend straight down to stretch your elbow. Wiggle your fingers and pump your first while your in the sling to prevent hand swelling. Use ice on the shoulder intermittently over the first 48 hours after surgery. Continue to use ice or and ice machine as needed after 48 hours for pain control/swelling.  Pain medicine has been prescribed for you.  Use your medicine liberally over the first 48 hours, and then you can begin to taper your use. You may take Extra Strength Tylenol  or Tylenol  only in place of the pain pills. DO NOT take ANY nonsteroidal anti-inflammatory pain medications: Advil, Motrin, Ibuprofen, Aleve, Naproxen or Naprosyn.  Resume aspirin  the day after surgery Leave your dressing on until your first follow up visit.  You may shower with the dressing.  Hold your arm as if you still have your sling on while you shower. Simply allow the water to wash over the site and then pat dry. Make sure your axilla (armpit) is completely dry after showering.    Please call [phone] during normal business hours or [phone] after hours for any problems. Including the following:  - excessive redness of the incisions - drainage for more than 4 days - fever of more than 101.5 F  *Please note that pain medications will not be refilled after hours or on weekends.

## 2024-12-01 ENCOUNTER — Encounter (HOSPITAL_COMMUNITY): Payer: Self-pay | Admitting: Orthopedic Surgery
